# Patient Record
Sex: Female | Born: 1950 | Race: Black or African American | Hispanic: No | Marital: Married | Smoking: Never smoker
Health system: Southern US, Community
[De-identification: ages and names within clinical notes are randomized; demographics above are authoritative.]

## PROBLEM LIST (undated history)

## (undated) DIAGNOSIS — I219 Acute myocardial infarction, unspecified: Secondary | ICD-10-CM

## (undated) DIAGNOSIS — I10 Essential (primary) hypertension: Secondary | ICD-10-CM

## (undated) HISTORY — PX: COLON SURGERY: SHX602

## (undated) HISTORY — PX: OTHER SURGICAL HISTORY: SHX169

## (undated) HISTORY — PX: COLONOSCOPY: SHX174

---

## 2004-05-18 ENCOUNTER — Ambulatory Visit: Payer: Self-pay | Admitting: Internal Medicine

## 2004-05-21 ENCOUNTER — Ambulatory Visit: Payer: Self-pay | Admitting: Internal Medicine

## 2007-12-31 ENCOUNTER — Ambulatory Visit: Payer: Self-pay | Admitting: Gastroenterology

## 2008-01-21 ENCOUNTER — Ambulatory Visit: Payer: Self-pay | Admitting: General Surgery

## 2008-01-28 ENCOUNTER — Inpatient Hospital Stay: Payer: Self-pay | Admitting: General Surgery

## 2015-02-01 ENCOUNTER — Inpatient Hospital Stay
Admit: 2015-02-01 | Discharge: 2015-02-01 | Disposition: A | Discharge status: Home / Self Care | Payer: Medicare PPO | Attending: Internal Medicine | Admitting: Internal Medicine

## 2015-02-01 ENCOUNTER — Inpatient Hospital Stay
Admission: EM | Admit: 2015-02-01 | Discharge: 2015-02-03 | DRG: 247 | Disposition: A | Discharge status: Home / Self Care | Payer: Medicare PPO | Attending: Internal Medicine | Admitting: Internal Medicine

## 2015-02-01 ENCOUNTER — Emergency Department: Payer: Medicare PPO

## 2015-02-01 ENCOUNTER — Encounter: Payer: Self-pay | Admitting: Emergency Medicine

## 2015-02-01 DIAGNOSIS — R079 Chest pain, unspecified: Secondary | ICD-10-CM

## 2015-02-01 DIAGNOSIS — I214 Non-ST elevation (NSTEMI) myocardial infarction: Principal | ICD-10-CM | POA: Diagnosis present

## 2015-02-01 DIAGNOSIS — Z9889 Other specified postprocedural states: Secondary | ICD-10-CM | POA: Diagnosis not present

## 2015-02-01 DIAGNOSIS — Z9851 Tubal ligation status: Secondary | ICD-10-CM

## 2015-02-01 DIAGNOSIS — E785 Hyperlipidemia, unspecified: Secondary | ICD-10-CM | POA: Diagnosis present

## 2015-02-01 DIAGNOSIS — I200 Unstable angina: Secondary | ICD-10-CM

## 2015-02-01 DIAGNOSIS — Z8249 Family history of ischemic heart disease and other diseases of the circulatory system: Secondary | ICD-10-CM | POA: Diagnosis not present

## 2015-02-01 DIAGNOSIS — I4510 Unspecified right bundle-branch block: Secondary | ICD-10-CM | POA: Diagnosis present

## 2015-02-01 LAB — PROTIME-INR
INR: 1.01
PROTHROMBIN TIME: 13.5 s (ref 11.4–15.0)

## 2015-02-01 LAB — TROPONIN I
TROPONIN I: 2.29 ng/mL — AB (ref <0.031)
Troponin I: 1.44 ng/mL — ABNORMAL HIGH (ref <0.031)
Troponin I: 2.87 ng/mL — ABNORMAL HIGH (ref <0.031)

## 2015-02-01 LAB — COMPREHENSIVE METABOLIC PANEL
ALBUMIN: 4.4 g/dL (ref 3.5–5.0)
ALK PHOS: 85 U/L (ref 38–126)
ALT: 16 U/L (ref 14–54)
ANION GAP: 7 (ref 5–15)
AST: 35 U/L (ref 15–41)
BILIRUBIN TOTAL: 0.3 mg/dL (ref 0.3–1.2)
BUN: 16 mg/dL (ref 6–20)
CALCIUM: 9.6 mg/dL (ref 8.9–10.3)
CO2: 28 mmol/L (ref 22–32)
CREATININE: 1.18 mg/dL — AB (ref 0.44–1.00)
Chloride: 107 mmol/L (ref 101–111)
GFR calc Af Amer: 55 mL/min — ABNORMAL LOW (ref >60)
GFR calc non Af Amer: 47 mL/min — ABNORMAL LOW (ref >60)
GLUCOSE: 100 mg/dL — AB (ref 65–99)
Potassium: 3.6 mmol/L (ref 3.5–5.1)
SODIUM: 142 mmol/L (ref 135–145)
TOTAL PROTEIN: 8.2 g/dL — AB (ref 6.5–8.1)

## 2015-02-01 LAB — HEPARIN LEVEL (UNFRACTIONATED)
HEPARIN UNFRACTIONATED: 1.18 [IU]/mL — AB (ref 0.30–0.70)
Heparin Unfractionated: 0.95 IU/mL — ABNORMAL HIGH (ref 0.30–0.70)

## 2015-02-01 LAB — CBC
HEMATOCRIT: 43.5 % (ref 35.0–47.0)
HEMOGLOBIN: 14.5 g/dL (ref 12.0–16.0)
MCH: 32.5 pg (ref 26.0–34.0)
MCHC: 33.3 g/dL (ref 32.0–36.0)
MCV: 97.6 fL (ref 80.0–100.0)
Platelets: 175 10*3/uL (ref 150–440)
RBC: 4.46 MIL/uL (ref 3.80–5.20)
RDW: 12.5 % (ref 11.5–14.5)
WBC: 6.3 10*3/uL (ref 3.6–11.0)

## 2015-02-01 LAB — APTT: aPTT: 27 seconds (ref 24–36)

## 2015-02-01 MED ORDER — SODIUM CHLORIDE 0.9 % WEIGHT BASED INFUSION
3.0000 mL/kg/h | INTRAVENOUS | Status: DC
  Administered 2015-02-02: 3 mL/kg/h via INTRAVENOUS

## 2015-02-01 MED ORDER — ASPIRIN 81 MG PO CHEW
81.0000 mg | CHEWABLE_TABLET | ORAL | Status: AC
  Administered 2015-02-02: 81 mg via ORAL
  Filled 2015-02-01: qty 1

## 2015-02-01 MED ORDER — HEPARIN (PORCINE) IN NACL 100-0.45 UNIT/ML-% IJ SOLN
600.0000 [IU]/h | INTRAMUSCULAR | Status: DC
Start: 2015-02-01 — End: 2015-02-02
  Administered 2015-02-01: 800 [IU]/h via INTRAVENOUS
  Filled 2015-02-01: qty 250

## 2015-02-01 MED ORDER — GI COCKTAIL ~~LOC~~
30.0000 mL | Freq: Once | ORAL | Status: AC
  Administered 2015-02-01: 30 mL via ORAL
  Filled 2015-02-01: qty 30

## 2015-02-01 MED ORDER — ACETAMINOPHEN 325 MG PO TABS: 650.0000 mg | ORAL_TABLET | Freq: Four times a day (QID) | ORAL | Status: DC | PRN

## 2015-02-01 MED ORDER — ONDANSETRON HCL 4 MG/2ML IJ SOLN
4.0000 mg | Freq: Once | INTRAMUSCULAR | Status: AC
  Administered 2015-02-01: 4 mg via INTRAVENOUS
  Filled 2015-02-01: qty 2

## 2015-02-01 MED ORDER — SODIUM CHLORIDE 0.9 % IV SOLN
INTRAVENOUS | Status: DC
  Administered 2015-02-01: 18:00:00 via INTRAVENOUS

## 2015-02-01 MED ORDER — HYDROCODONE-ACETAMINOPHEN 5-325 MG PO TABS: 1.0000 | ORAL_TABLET | ORAL | Status: DC | PRN

## 2015-02-01 MED ORDER — SENNOSIDES-DOCUSATE SODIUM 8.6-50 MG PO TABS: 1.0000 | ORAL_TABLET | Freq: Every evening | ORAL | Status: DC | PRN

## 2015-02-01 MED ORDER — INFLUENZA VAC SPLIT QUAD 0.5 ML IM SUSY: 0.5000 mL | PREFILLED_SYRINGE | INTRAMUSCULAR | Status: DC

## 2015-02-01 MED ORDER — SODIUM CHLORIDE 0.9 % IJ SOLN
3.0000 mL | Freq: Two times a day (BID) | INTRAMUSCULAR | Status: DC
  Administered 2015-02-02: 3 mL via INTRAVENOUS

## 2015-02-01 MED ORDER — ONDANSETRON HCL 4 MG PO TABS
4.0000 mg | ORAL_TABLET | Freq: Four times a day (QID) | ORAL | Status: DC | PRN
Start: 2015-02-01 — End: 2015-02-03

## 2015-02-01 MED ORDER — ALUM & MAG HYDROXIDE-SIMETH 200-200-20 MG/5ML PO SUSP: 30.0000 mL | Freq: Four times a day (QID) | ORAL | Status: DC | PRN

## 2015-02-01 MED ORDER — OMEGA-3-ACID ETHYL ESTERS 1 G PO CAPS
1.0000 g | ORAL_CAPSULE | Freq: Every day | ORAL | Status: DC
  Administered 2015-02-01 – 2015-02-03 (×3): 1 g via ORAL
  Filled 2015-02-01 (×4): qty 1

## 2015-02-01 MED ORDER — ACETAMINOPHEN 650 MG RE SUPP: 650.0000 mg | Freq: Four times a day (QID) | RECTAL | Status: DC | PRN

## 2015-02-01 MED ORDER — METOPROLOL TARTRATE 25 MG PO TABS
25.0000 mg | ORAL_TABLET | Freq: Two times a day (BID) | ORAL | Status: DC
  Administered 2015-02-02 – 2015-02-03 (×3): 25 mg via ORAL
  Filled 2015-02-01 (×4): qty 1

## 2015-02-01 MED ORDER — SODIUM CHLORIDE 0.9 % WEIGHT BASED INFUSION: 1.0000 mL/kg/h | INTRAVENOUS | Status: DC

## 2015-02-01 MED ORDER — ONDANSETRON HCL 4 MG/2ML IJ SOLN: 4.0000 mg | Freq: Four times a day (QID) | INTRAMUSCULAR | Status: DC | PRN

## 2015-02-01 MED ORDER — HEPARIN BOLUS VIA INFUSION
4000.0000 [IU] | Freq: Once | INTRAVENOUS | Status: AC
  Administered 2015-02-01: 4000 [IU] via INTRAVENOUS
  Filled 2015-02-01: qty 4000

## 2015-02-01 MED ORDER — NITROGLYCERIN 0.4 MG SL SUBL: 0.4000 mg | SUBLINGUAL_TABLET | SUBLINGUAL | Status: DC | PRN

## 2015-02-01 MED ORDER — SIMVASTATIN 20 MG PO TABS
20.0000 mg | ORAL_TABLET | Freq: Every day | ORAL | Status: DC
  Administered 2015-02-01: 20 mg via ORAL
  Filled 2015-02-01: qty 1

## 2015-02-01 MED ORDER — ASPIRIN EC 81 MG PO TBEC
81.0000 mg | DELAYED_RELEASE_TABLET | Freq: Every day | ORAL | Status: DC
  Administered 2015-02-01 – 2015-02-03 (×3): 81 mg via ORAL
  Filled 2015-02-01 (×3): qty 1

## 2015-02-01 MED ORDER — ASPIRIN 81 MG PO CHEW
324.0000 mg | CHEWABLE_TABLET | Freq: Once | ORAL | Status: AC
  Administered 2015-02-01: 324 mg via ORAL
  Filled 2015-02-01: qty 4

## 2015-02-01 MED ORDER — KRILL OIL OMEGA-3 300 MG PO CAPS: ORAL_CAPSULE | Freq: Every day | ORAL | Status: DC

## 2015-02-01 NOTE — Progress Notes (Signed)
ANTICOAGULATION CONSULT NOTE - Initial Consult  Pharmacy Consult for heparin drip monitoring and dosing Indication: chest pain/ACS  No Known Allergies  Patient Measurements: Height:  (175.3 cm) Weight: 145 lb (65.772 kg) IBW/kg (Calculated) : 66.2 Heparin Dosing Weight: 65.8 kg  Vital Signs: Temp: 97.8 F (36.6 C) (09/13 0952) Temp Source: Oral (09/13 0952) BP: 153/74 mmHg (09/13 1435) Pulse Rate: 60 (09/13 1435)  Labs:  Recent Labs  02/01/15 1047  HGB 14.5  HCT 43.5  PLT 175  APTT 27  LABPROT 13.5  INR 1.01  CREATININE 1.18*  TROPONINI 1.44*    Estimated Creatinine Clearance: 49.4 mL/min (by C-G formula based on Cr of 1.18).   Medical History: History reviewed. No pertinent past medical history.  Medications:  Scheduled:    Assessment: Pharmacy consulted to dose heparin drip for ACS/chest pain.   Goal of Therapy:  Heparin level 0.3-0.7 units/ml   Plan:  Give 4000 units bolus x 1 Start heparin infusion at 800 units/hr Check anti-Xa level in 6 hours and daily while on heparin Continue to monitor H&H and platelets  Jodelle Red Lyndsi Altic 02/01/2015,2:48 PM

## 2015-02-01 NOTE — ED Provider Notes (Signed)
[facility] Emergency Department Provider Note  ____________________________________________  Time seen: On arrival  I have reviewed the triage vital signs and the nursing notes.   HISTORY  Chief Complaint Heartburn    HPI Kristina Macias is a 64 y.o. female who presents with complaints of burning in her chest intermittently for 2 days. She reports the pain does get worse with exertion and resolves with rest. She is never had this before. She is the pain is burning and severe at times. She denies shortness of breath. No recent travel. No lower extremity swelling. No fevers chills or cough     History reviewed. No pertinent past medical history.  There are no active problems to display for this patient.   Past Surgical History  Procedure Laterality Date  . Colon surgery      Current Outpatient Rx  Name  Route  Sig  Dispense  Refill  . calcium-vitamin D (OSCAL WITH D) 500-200 MG-UNIT per tablet   Oral   Take 1 tablet by mouth.         Marland Kitchen KRILL OIL OMEGA-3 PO   Oral   Take 1 capsule by mouth daily.           Allergies Review of patient's allergies indicates no known allergies.  No family history on file.  Social History Social History  Substance Use Topics  . Smoking status: Never Smoker   . Smokeless tobacco: None  . Alcohol Use: No    Review of Systems  Constitutional: Negative for fever. Eyes: Negative for visual changes. ENT: Negative for sore throat Cardiovascular: Positive for chest pain. Respiratory: Negative for shortness of breath. Gastrointestinal: Negative for abdominal pain, vomiting and diarrhea. Genitourinary: Negative for dysuria. Musculoskeletal: Negative for back pain. Skin: Negative for rash. Neurological: Negative for headaches or focal weakness Psychiatric: No Anxiety    ____________________________________________   PHYSICAL EXAM:  VITAL SIGNS: ED Triage Vitals  Enc Vitals Group     BP 02/01/15  0952 139/73 mmHg     Pulse Rate 02/01/15 0952 84     Resp 02/01/15 0952 18     Temp 02/01/15 0952 97.8 F (36.6 C)     Temp Source 02/01/15 0952 Oral     SpO2 02/01/15 0952 100 %     Weight 02/01/15 0952 145 lb (65.772 kg)     Height 02/01/15 0952 5\' 9"  (1.753 m)     Head Cir --      Peak Flow --      Pain Score 02/01/15 1005 9     Pain Loc --      Pain Edu? --      Excl. in GC? --      Constitutional: Alert and oriented. Well appearing and in no distress. Eyes: Conjunctivae are normal.  ENT   Head: Normocephalic and atraumatic.   Mouth/Throat: Mucous membranes are moist. Cardiovascular: Normal rate, regular rhythm. Normal and symmetric distal pulses are present in all extremities. No murmurs, rubs, or gallops. Respiratory: Normal respiratory effort without tachypnea nor retractions. Breath sounds are clear and equal bilaterally.  Gastrointestinal: Soft and non-tender in all quadrants. No distention. There is no CVA tenderness. Genitourinary: deferred Musculoskeletal: Nontender with normal range of motion in all extremities. No lower extremity tenderness nor edema. Neurologic:  Normal speech and language. No gross focal neurologic deficits are appreciated. Skin:  Skin is warm, dry and intact. No rash noted. Psychiatric: Mood and affect are normal. Patient exhibits appropriate insight and judgment.  ____________________________________________    LABS (pertinent positives/negatives)  Labs Reviewed  COMPREHENSIVE METABOLIC PANEL - Abnormal; Notable for the following:    Glucose, Bld 100 (*)    Creatinine, Ser 1.18 (*)    Total Protein 8.2 (*)    GFR calc non Af Amer 47 (*)    GFR calc Af Amer 55 (*)    All other components within normal limits  TROPONIN I - Abnormal; Notable for the following:    Troponin I 1.44 (*)    All other components within normal limits  CBC  APTT  PROTIME-INR    ____________________________________________   EKG  ED ECG REPORT I,  Jene Every, the attending physician, personally viewed and interpreted this ECG.   Date: 02/01/2015  EKG Time: 10:11 AM  Rate: 73  Rhythm: RBBB  Axis: Left axis  Intervals:right bundle branch block  ST&T Change: Nonspecific  No old EKG to compare to ____________________________________________    RADIOLOGY I have personally reviewed any xrays that were ordered on this patient: Chest x-ray unremarkable  ____________________________________________   PROCEDURES  Procedure(s) performed: none  Critical Care performed: yes  CRITICAL CARE Performed by: Jene Every   Total critical care time: 30 min  Critical care time was exclusive of separately billable procedures and treating other patients.  Critical care was necessary to treat or prevent imminent or life-threatening deterioration.  Critical care was time spent personally by me on the following activities: development of treatment plan with patient and/or surrogate as well as nursing, discussions with consultants, evaluation of patient's response to treatment, examination of patient, obtaining history from patient or surrogate, ordering and performing treatments and interventions, ordering and review of laboratory studies, ordering and review of radiographic studies, pulse oximetry and re-evaluation of patient's condition.   ____________________________________________   INITIAL IMPRESSION / ASSESSMENT AND PLAN / ED COURSE  Pertinent labs & imaging results that were available during my care of the patient were reviewed by me and considered in my medical decision making (see chart for details).  Patient with burning sensation in the chest with concerning worsening with exertion. She has only mild pain currently. We will give a GI cocktail but also check cardiac enzymes, chest x-ray, EKG and then reevaluate.  ----------------------------------------- 12:48 PM on  02/01/2015 -----------------------------------------  Patient reports she is totally pain-free. However her troponin is elevated and given her history of present illness I'm concerned about unstable angina. I will order heparin drip and admit the patient  ____________________________________________   FINAL CLINICAL IMPRESSION(S) / ED DIAGNOSES  Final diagnoses:  Unstable angina     Jene Every, MD 02/01/15 1248

## 2015-02-01 NOTE — Consult Note (Signed)
[facility] Cardiology  CARDIOLOGY CONSULT NOTE  Patient ID: Kristina Macias MRN: [id number] DOB/AGE: 1950-08-21 64 y.o.  Admit date: 02/01/2015 Referring Physician [facility] Primary Physician  Primary Cardiologist  Reason for Consultation NSTEMI  HPI: The patient is a 64 year old female referred for evaluation of non-ST elevation myocardial infarction.the patient reports a 2 day history of intermittent episodes of chest pain. She describes sternal chest discomfort, feels like burning, typically occurs with exertion, relieved with rest. There is typically last 5-10 minutes. A day, the patient picked up her grandchild prior to getting her car, mid substernal chest discomfort without radiation, nausea or vomiting. He called her husband, who brought her to [facility] emergency room. EKG revealed sinus rhythm with right bundle-branch block. Patient was given an aspirin and a GI cocktail.initial lab work revealed elevated troponin of 1.44.  Review of systems complete and found to be negative unless listed above     History reviewed. No pertinent past medical history.  Past Surgical History  Procedure Laterality Date  . Colon surgery      Prescriptions prior to admission  Medication Sig Dispense Refill Last Dose  . calcium-vitamin D (OSCAL WITH D) 500-200 MG-UNIT per tablet Take 1 tablet by mouth.     Marland Kitchen KRILL OIL OMEGA-3 PO Take 1 capsule by mouth daily.      Social History   Social History  . Marital Status: Married    Spouse Name: N/A  . Number of Children: N/A  . Years of Education: N/A   Occupational History  . Not on file.   Social History Main Topics  . Smoking status: Never Smoker   . Smokeless tobacco: Not on file  . Alcohol Use: No  . Drug Use: Not on file  . Sexual Activity: Not on file   Other Topics Concern  . Not on file   Social History Narrative  . No narrative on file    No family history on file.    Review of systems complete and found to be negative unless listed above       PHYSICAL EXAM  General: Well developed, well nourished, in no acute distress HEENT:  Normocephalic and atramatic Neck:  No JVD.  Lungs: Clear bilaterally to auscultation and percussion. Heart: HRRR . Normal S1 and S2 without gallops or murmurs.  Abdomen: Bowel sounds are positive, abdomen soft and non-tender  Msk:  Back normal, normal gait. Normal strength and tone for age. Extremities: No clubbing, cyanosis or edema.   Neuro: Alert and oriented X 3. Psych:  Good affect, responds appropriately  Labs:   Lab Results  Component Value Date   WBC 6.3 02/01/2015   HGB 14.5 02/01/2015   HCT 43.5 02/01/2015   MCV 97.6 02/01/2015   PLT 175 02/01/2015    Recent Labs Lab 02/01/15 1047  NA 142  K 3.6  CL 107  CO2 28  BUN 16  CREATININE 1.18*  CALCIUM 9.6  PROT 8.2*  BILITOT 0.3  ALKPHOS 85  ALT 16  AST 35  GLUCOSE 100*   Lab Results  Component Value Date   TROPONINI 1.44* 02/01/2015   No results found for: CHOL No results found for: HDL No results found for: LDLCALC No results found for: TRIG No results found for: CHOLHDL No results found for: LDLDIRECT    Radiology: Dg Chest Portable 1 View  02/01/2015   CLINICAL DATA:  Acute chest pain for 2 days.  EXAM: PORTABLE CHEST - 1 VIEW  COMPARISON:  None.  FINDINGS: The cardiomediastinal silhouette is unremarkable.  There is no evidence of focal airspace disease, pulmonary edema, suspicious pulmonary nodule/mass, pleural effusion, or pneumothorax. No acute bony abnormalities are identified.  IMPRESSION: No active disease.   Electronically Signed   By: Harmon Pier M.D.   On: 02/01/2015 12:14    EKG: normal sinus rhythm with right bundle branch block  ASSESSMENT AND PLAN:   Non-ST elevation myocardial infarction, with recurrent episodes of chest pain, with EKG revealed right bundle branch block, and elevated troponin.  Recommendations  1. Agree with overall current therapy 2. Continue heparin drip 3.  Proceed  with cardiac catheterization with selective coronary arteriography scheduled for 02/02/15. The risks, benefits and alternatives to cardiac catheterization were explained to the patient and informed written consent was obtained.  SignedMarcina Millard MD,PhD, [facility] 02/01/2015, 5:45 PM

## 2015-02-01 NOTE — ED Notes (Signed)
Pt states has had "burning" in her chest since Sunday. States it only hurts when she moves.

## 2015-02-01 NOTE — Progress Notes (Signed)
ANTICOAGULATION CONSULT NOTE - Initial Consult  Pharmacy Consult for heparin drip monitoring and dosing Indication: chest pain/ACS  No Known Allergies  Patient Measurements: Height:  (175.3 cm) Weight: 131 lb 3.2 oz (59.512 kg) IBW/kg (Calculated) : 66.2 Heparin Dosing Weight: 65.8 kg  Vital Signs: Temp: 98.5 F (36.9 C) (09/13 1941) Temp Source: Oral (09/13 1941) BP: 121/64 mmHg (09/13 1941) Pulse Rate: 66 (09/13 1941)  Labs:  Recent Labs  02/01/15 1047 02/01/15 1720 02/01/15 2106  HGB 14.5  --   --   HCT 43.5  --   --   PLT 175  --   --   APTT 27  --   --   LABPROT 13.5  --   --   INR 1.01  --   --   HEPARINUNFRC  --  1.18* 0.95*  CREATININE 1.18*  --   --   TROPONINI 1.44* 2.29*  --     Estimated Creatinine Clearance: 44.6 mL/min (by C-G formula based on Cr of 1.18).   Medical History: History reviewed. No pertinent past medical history.  Medications:  Scheduled:  . [START ON 02/02/2015] aspirin  81 mg Oral Pre-Cath  . aspirin EC  81 mg Oral Daily  . [START ON 02/02/2015] Influenza vac split quadrivalent PF  0.5 mL Intramuscular Tomorrow-1000  . metoprolol tartrate  25 mg Oral BID  . omega-3 acid ethyl esters  1 g Oral Daily  . simvastatin  20 mg Oral q1800  . sodium chloride  3 mL Intravenous Q12H    Assessment: Pharmacy consulted to dose heparin drip for ACS/chest pain.   Goal of Therapy:  Heparin level 0.3-0.7 units/ml   Plan:  Give 4000 units bolus x 1 Start heparin infusion at 800 units/hr Check anti-Xa level in 6 hours and daily while on heparin Continue to monitor H&H and platelets   9/13:  HL @ 21:30 =  0.95 Will decrease rate to 700 units/hr and recheck HL on 9/14 @ 4:00.   Luretha Eberly D 02/01/2015,10:17 PM

## 2015-02-01 NOTE — Progress Notes (Signed)
*  PRELIMINARY RESULTS* Echocardiogram 2D Echocardiogram has been performed.  Kristina Macias 02/01/2015, 6:57 PM

## 2015-02-01 NOTE — H&P (Signed)
[facility] [place] [facility]   PATIENT NAME: Kristina Macias    MR#:  [id number]  DATE OF BIRTH:  08-11-1950  DATE OF ADMISSION:  02/01/2015  PRIMARY CARE PHYSICIAN: Barbette Merino, NP   REQUESTING/REFERRING PHYSICIAN: Dr. Cyril Loosen  CHIEF COMPLAINT:  Burning pain near heart HISTORY OF PRESENT ILLNESS:  Kristina Macias  is a 64 y.o. female with no past medical history who presents with above complaint. Patient reports and send a she's had burning midsternal pain without radiation. She says the burning is worse with exertion and better with rest. Last prostate 10 minutes. It has been occurring a few times a day since Sunday. She was worried so she presented to the ER for further evaluation. In the emergency room she had a GI cocktail which alleviated the burning sensation. She had an EKG which shows a right bundle branch block and LAD as well as an elevated troponin. There are no associated symptoms with this burning pain. She is not recently traveled far distance or had recent surgery. She does not have tachycardia or hypoxia in the emergency room. PAST MEDICAL HISTORY:  None  PAST SURGICAL HISTORY:   Past Surgical History  Procedure Laterality Date  . Colon surgery     tubal ligation  SOCIAL HISTORY:   Social History  Substance Use Topics  . Smoking status: Never Smoker   . Smokeless tobacco: Not on file  . Alcohol Use: No    FAMILY HISTORY:  Positive history of hypertension no CAD or stroke  DRUG ALLERGIES:  No Known Allergies   REVIEW OF SYSTEMS:  CONSTITUTIONAL: No fever, fatigue or weakness.  EYES: No blurred or double vision.  EARS, NOSE, AND THROAT: No tinnitus or ear pain.  RESPIRATORY: No cough, shortness of breath, wheezing or hemoptysis.  CARDIOVASCULAR: No chest pain, orthopnea, edema. She denies chest pain but has burning sensation midepigastric region GASTROINTESTINAL: No nausea, vomiting, diarrhea or abdominal pain.   GENITOURINARY: No dysuria, hematuria.  ENDOCRINE: No polyuria, nocturia,  HEMATOLOGY: No anemia, easy bruising or bleeding SKIN: No rash or lesion. MUSCULOSKELETAL: No joint pain or arthritis.   NEUROLOGIC: No tingling, numbness, weakness.  PSYCHIATRY: No anxiety or depression.   MEDICATIONS AT HOME:   Prior to Admission medications   Medication Sig Start Date End Date Taking? Authorizing Provider  calcium-vitamin D (OSCAL WITH D) 500-200 MG-UNIT per tablet Take 1 tablet by mouth.   Yes Historical Provider, MD  KRILL OIL OMEGA-3 PO Take 1 capsule by mouth daily.   Yes Historical Provider, MD      VITAL SIGNS:  Blood pressure 141/77, pulse 63, temperature 97.8 F (36.6 C), temperature source Oral, resp. rate 18, height  (1.753 m), weight 65.772 kg (145 lb), SpO2 100 %.  PHYSICAL EXAMINATION:  GENERAL:  64 y.o.-year-old patient lying in the bed with no acute distress.  EYES: Pupils equal, round, reactive to light and accommodation. No scleral icterus. Extraocular muscles intact.  HEENT: Head atraumatic, normocephalic. Oropharynx and nasopharynx clear.  NECK:  Supple, no jugular venous distention. No thyroid enlargement, no tenderness.  LUNGS: Normal breath sounds bilaterally, no wheezing, rales,rhonchi or crepitation. No use of accessory muscles of respiration.  CARDIOVASCULAR: S1, S2 normal. No murmurs, rubs, or gallops.  ABDOMEN: Soft, nontender, nondistended. Bowel sounds present. No organomegaly or mass.  EXTREMITIES: No pedal edema, cyanosis, or clubbing.  NEUROLOGIC: Cranial nerves II through XII are grossly intact. No focal deficits. PSYCHIATRIC: The patient is alert and oriented x  3.  SKIN: No obvious rash, lesion, or ulcer.   LABORATORY PANEL:   CBC  Recent Labs Lab 02/01/15 1047  WBC 6.3  HGB 14.5  HCT 43.5  PLT 175   ------------------------------------------------------------------------------------------------------------------  Chemistries   Recent  Labs Lab 02/01/15 1047  NA 142  K 3.6  CL 107  CO2 28  GLUCOSE 100*  BUN 16  CREATININE 1.18*  CALCIUM 9.6  AST 35  ALT 16  ALKPHOS 85  BILITOT 0.3   ------------------------------------------------------------------------------------------------------------------  Cardiac Enzymes  Recent Labs Lab 02/01/15 1047  TROPONINI 1.44*   ------------------------------------------------------------------------------------------------------------------  RADIOLOGY:  Dg Chest Portable 1 View  02/01/2015   CLINICAL DATA:  Acute chest pain for 2 days.  EXAM: PORTABLE CHEST - 1 VIEW  COMPARISON:  None.  FINDINGS: The cardiomediastinal silhouette is unremarkable.  There is no evidence of focal airspace disease, pulmonary edema, suspicious pulmonary nodule/mass, pleural effusion, or pneumothorax. No acute bony abnormalities are identified.  IMPRESSION: No active disease.   Electronically Signed   By: Harmon Pier M.D.   On: 02/01/2015 12:14    EKG:  Normal sinus rhythm with right bundle branch block  IMPRESSION AND PLAN:  This is a very pleasant 64 year old female with a past medical history who presents with burning sensation worse with exertion and better with rest and found to have a elevation in her troponin.   1. Non-STEMI: Patient presents with burning sensation in the midepigastric region worse with exertion and better with rest with no associated symptoms. Her troponin is elevated which is concerning for non-STEMI. Patient has consented for heparin drip. I have reviewed side effects, alternatives and risks including bleeding. She consents to these risks and accepts these risk. I have started aspirin, beta blocker and statin medication which I have also reviewed with her. I will speak with the cardiologist on call and order echocardiogram. Further recommendations as per cardiology. Lipid panel will be ordered for a.m. as well.      All the records are reviewed and case discussed with  ED provider. Management plans discussed with the patient and she is in agreement.  CODE STATUS: FULL  TOTAL TIME TAKING CARE OF THIS PATIENT: 45 minutes.    Kristina Macias M.D on 02/01/2015 at 1:37 PM  Between 7am to 6pm - Pager - [phone] After 6pm go to [email or web address] - password [email or web address] [facility]  [facility]  Office  [phone]  CC: Primary care physician; Barbette Merino, NP

## 2015-02-02 ENCOUNTER — Encounter
Admission: EM | Disposition: A | Discharge status: Home / Self Care | Payer: Self-pay | Source: Home / Self Care | Attending: Internal Medicine

## 2015-02-02 HISTORY — PX: CARDIAC CATHETERIZATION: SHX172

## 2015-02-02 LAB — CBC WITH DIFFERENTIAL/PLATELET
BASOS ABS: 0 10*3/uL (ref 0–0.1)
BASOS PCT: 1 %
EOS ABS: 0 10*3/uL (ref 0–0.7)
Eosinophils Relative: 1 %
HCT: 37.3 % (ref 35.0–47.0)
HEMOGLOBIN: 12.7 g/dL (ref 12.0–16.0)
Lymphocytes Relative: 36 %
Lymphs Abs: 2.1 10*3/uL (ref 1.0–3.6)
MCH: 32.9 pg (ref 26.0–34.0)
MCHC: 34.1 g/dL (ref 32.0–36.0)
MCV: 96.4 fL (ref 80.0–100.0)
Monocytes Absolute: 0.5 10*3/uL (ref 0.2–0.9)
Monocytes Relative: 9 %
NEUTROS PCT: 55 %
Neutro Abs: 3.2 10*3/uL (ref 1.4–6.5)
Platelets: 156 10*3/uL (ref 150–440)
RBC: 3.87 MIL/uL (ref 3.80–5.20)
RDW: 11.9 % (ref 11.5–14.5)
WBC: 5.8 10*3/uL (ref 3.6–11.0)

## 2015-02-02 LAB — BASIC METABOLIC PANEL
Anion gap: 3 — ABNORMAL LOW (ref 5–15)
BUN: 15 mg/dL (ref 6–20)
CHLORIDE: 111 mmol/L (ref 101–111)
CO2: 28 mmol/L (ref 22–32)
Calcium: 8.6 mg/dL — ABNORMAL LOW (ref 8.9–10.3)
Creatinine, Ser: 1.13 mg/dL — ABNORMAL HIGH (ref 0.44–1.00)
GFR calc Af Amer: 58 mL/min — ABNORMAL LOW (ref >60)
GFR, EST NON AFRICAN AMERICAN: 50 mL/min — AB (ref >60)
GLUCOSE: 93 mg/dL (ref 65–99)
POTASSIUM: 4 mmol/L (ref 3.5–5.1)
Sodium: 142 mmol/L (ref 135–145)

## 2015-02-02 LAB — HEMOGLOBIN A1C: HEMOGLOBIN A1C: 5.6 % (ref 4.0–6.0)

## 2015-02-02 LAB — LIPID PANEL
CHOL/HDL RATIO: 4.2 ratio
Cholesterol: 237 mg/dL — ABNORMAL HIGH (ref 0–200)
HDL: 56 mg/dL (ref >40)
LDL CALC: 172 mg/dL — AB (ref 0–99)
Triglycerides: 47 mg/dL (ref <150)
VLDL: 9 mg/dL (ref 0–40)

## 2015-02-02 LAB — TROPONIN I: Troponin I: 2.53 ng/mL — ABNORMAL HIGH (ref <0.031)

## 2015-02-02 LAB — HEPARIN LEVEL (UNFRACTIONATED): HEPARIN UNFRACTIONATED: 0.88 [IU]/mL — AB (ref 0.30–0.70)

## 2015-02-02 SURGERY — LEFT HEART CATH AND CORONARY ANGIOGRAPHY

## 2015-02-02 MED ORDER — FENTANYL CITRATE (PF) 100 MCG/2ML IJ SOLN
INTRAMUSCULAR | Status: DC | PRN
  Administered 2015-02-02: 50 ug via INTRAVENOUS

## 2015-02-02 MED ORDER — PROMETHAZINE HCL 25 MG/ML IJ SOLN
12.5000 mg | Freq: Once | INTRAMUSCULAR | Status: AC
  Administered 2015-02-02 (×2): 12.5 mg via INTRAVENOUS

## 2015-02-02 MED ORDER — SODIUM CHLORIDE 0.9 % IJ SOLN: 3.0000 mL | INTRAMUSCULAR | Status: DC | PRN

## 2015-02-02 MED ORDER — BIVALIRUDIN 250 MG IV SOLR
INTRAVENOUS | Status: AC
  Filled 2015-02-02: qty 250

## 2015-02-02 MED ORDER — ASPIRIN 81 MG PO CHEW: 81.0000 mg | CHEWABLE_TABLET | ORAL | Status: DC

## 2015-02-02 MED ORDER — SODIUM CHLORIDE 0.9 % IV SOLN: INTRAVENOUS | Status: DC

## 2015-02-02 MED ORDER — NITROGLYCERIN 1 MG/10 ML FOR IR/CATH LAB
INTRA_ARTERIAL | Status: DC | PRN
  Administered 2015-02-02 (×3): 200 ug via INTRACORONARY

## 2015-02-02 MED ORDER — FENTANYL CITRATE (PF) 100 MCG/2ML IJ SOLN
INTRAMUSCULAR | Status: AC
  Filled 2015-02-02: qty 2

## 2015-02-02 MED ORDER — TIROFIBAN HCL IV 5 MG/100ML
0.1500 ug/kg/min | INTRAVENOUS | Status: DC
  Administered 2015-02-02: 0.15 ug/kg/min via INTRAVENOUS

## 2015-02-02 MED ORDER — CLOPIDOGREL BISULFATE 75 MG PO TABS
ORAL_TABLET | ORAL | Status: AC
  Filled 2015-02-02: qty 8

## 2015-02-02 MED ORDER — ONDANSETRON HCL 4 MG/2ML IJ SOLN
INTRAMUSCULAR | Status: DC | PRN
  Administered 2015-02-02: 4 mg via INTRAVENOUS

## 2015-02-02 MED ORDER — ASPIRIN 81 MG PO CHEW
CHEWABLE_TABLET | ORAL | Status: DC | PRN
  Administered 2015-02-02: 243 mg via ORAL

## 2015-02-02 MED ORDER — SODIUM CHLORIDE 0.9 % IV SOLN
INTRAVENOUS | Status: DC
  Administered 2015-02-02: 17:00:00 via INTRAVENOUS

## 2015-02-02 MED ORDER — SODIUM CHLORIDE 0.9 % WEIGHT BASED INFUSION: 3.0000 mL/kg/h | INTRAVENOUS | Status: DC

## 2015-02-02 MED ORDER — SODIUM CHLORIDE 0.9 % IJ SOLN
3.0000 mL | Freq: Two times a day (BID) | INTRAMUSCULAR | Status: DC
  Administered 2015-02-02: 3 mL via INTRAVENOUS

## 2015-02-02 MED ORDER — SODIUM CHLORIDE 0.9 % IV SOLN
250.0000 mg | INTRAVENOUS | Status: DC | PRN
  Administered 2015-02-02: 1.75 mg/kg/h via INTRAVENOUS

## 2015-02-02 MED ORDER — ATORVASTATIN CALCIUM 20 MG PO TABS
80.0000 mg | ORAL_TABLET | Freq: Every day | ORAL | Status: DC
  Administered 2015-02-02: 80 mg via ORAL
  Filled 2015-02-02: qty 4
  Filled 2015-02-02: qty 1

## 2015-02-02 MED ORDER — TIROFIBAN HCL IV 12.5 MG/250 ML
INTRAVENOUS | Status: AC
  Filled 2015-02-02: qty 250

## 2015-02-02 MED ORDER — MIDAZOLAM HCL 2 MG/2ML IJ SOLN
INTRAMUSCULAR | Status: DC | PRN
  Administered 2015-02-02: 1 mg via INTRAVENOUS
  Administered 2015-02-02: 0.5 mg via INTRAVENOUS

## 2015-02-02 MED ORDER — ASPIRIN 81 MG PO CHEW
CHEWABLE_TABLET | ORAL | Status: AC
  Filled 2015-02-02: qty 3

## 2015-02-02 MED ORDER — ONDANSETRON HCL 4 MG/2ML IJ SOLN
INTRAMUSCULAR | Status: AC
  Filled 2015-02-02: qty 2

## 2015-02-02 MED ORDER — HEPARIN (PORCINE) IN NACL 2-0.9 UNIT/ML-% IJ SOLN
INTRAMUSCULAR | Status: AC
  Filled 2015-02-02: qty 1000

## 2015-02-02 MED ORDER — NITROGLYCERIN 5 MG/ML IV SOLN
INTRAVENOUS | Status: AC
  Filled 2015-02-02: qty 10

## 2015-02-02 MED ORDER — SODIUM CHLORIDE 0.9 % IV SOLN: 250.0000 mL | INTRAVENOUS | Status: DC | PRN

## 2015-02-02 MED ORDER — BIVALIRUDIN BOLUS VIA INFUSION - CUPID
INTRAVENOUS | Status: DC | PRN
  Administered 2015-02-02: 44.55 mg via INTRAVENOUS

## 2015-02-02 MED ORDER — TIROFIBAN (AGGRASTAT) BOLUS VIA INFUSION
INTRAVENOUS | Status: DC | PRN
  Administered 2015-02-02: 1485 ug via INTRAVENOUS

## 2015-02-02 MED ORDER — IOHEXOL 300 MG/ML  SOLN
INTRAMUSCULAR | Status: DC | PRN
Start: 2015-02-02 — End: 2015-02-02
  Administered 2015-02-02: 90 mL via INTRA_ARTERIAL
  Administered 2015-02-02: 150 mL via INTRA_ARTERIAL
  Administered 2015-02-02: 30 mL via INTRA_ARTERIAL
  Administered 2015-02-02: 100 mL via INTRA_ARTERIAL
  Administered 2015-02-02: 30 mL via INTRA_ARTERIAL

## 2015-02-02 MED ORDER — SODIUM CHLORIDE 0.9 % WEIGHT BASED INFUSION: 1.0000 mL/kg/h | INTRAVENOUS | Status: DC

## 2015-02-02 MED ORDER — PROMETHAZINE HCL 25 MG/ML IJ SOLN
INTRAMUSCULAR | Status: AC
  Administered 2015-02-02: 12.5 mg via INTRAVENOUS
  Filled 2015-02-02: qty 1

## 2015-02-02 MED ORDER — MIDAZOLAM HCL 2 MG/2ML IJ SOLN
INTRAMUSCULAR | Status: AC
  Filled 2015-02-02: qty 2

## 2015-02-02 SURGICAL SUPPLY — 18 items
BALLN TREK RX 2.25X12 (BALLOONS) ×3
BALLOON TREK RX 2.25X12 (BALLOONS) ×2 IMPLANT
CATH INFINITI 5FR ANG PIGTAIL (CATHETERS) ×3 IMPLANT
CATH INFINITI 5FR JL4 (CATHETERS) ×3 IMPLANT
CATH INFINITI JR4 5F (CATHETERS) ×3 IMPLANT
CATH VISTA GUIDE 6FR JR4 SH (CATHETERS) ×3 IMPLANT
DEVICE CLOSURE MYNXGRIP 6/7F (Vascular Products) ×3 IMPLANT
DEVICE INFLAT 30 PLUS (MISCELLANEOUS) ×3 IMPLANT
DEVICE SAFEGUARD 24CM (GAUZE/BANDAGES/DRESSINGS) ×6 IMPLANT
KIT MANI 3VAL PERCEP (MISCELLANEOUS) ×3 IMPLANT
NEEDLE PERC 18GX7CM (NEEDLE) ×3 IMPLANT
PACK CARDIAC CATH (CUSTOM PROCEDURE TRAY) ×3 IMPLANT
SHEATH AVANTI 5FR X 11CM (SHEATH) ×3 IMPLANT
SHEATH AVANTI 6FR X 11CM (SHEATH) ×3 IMPLANT
STENT XIENCE ALPINE RX 2.25X12 (Permanent Stent) ×3 IMPLANT
STENT XIENCE ALPINE RX 3.0X38 (Permanent Stent) ×3 IMPLANT
WIRE ASAHI PROWATER 180CM (WIRE) ×3 IMPLANT
WIRE EMERALD 3MM-J .035X150CM (WIRE) ×3 IMPLANT

## 2015-02-02 NOTE — Progress Notes (Signed)
Skin checked by Peyton Najjar RN

## 2015-02-02 NOTE — Progress Notes (Signed)
MD Paraschos was notified that BP was in the 80s. Pt was not responsive only to pain. 500cc bolus was started. Pt became more alert and began to recognize staff and family. NS @ 125 stop aggomax at 1:15am. PAD to come off at the same time.

## 2015-02-02 NOTE — Progress Notes (Signed)
ANTICOAGULATION CONSULT NOTE - Initial Consult  Pharmacy Consult for heparin drip monitoring and dosing Indication: chest pain/ACS  No Known Allergies  Patient Measurements: Height:  (175.3 cm) Weight: 131 lb 3.2 oz (59.512 kg) IBW/kg (Calculated) : 66.2 Heparin Dosing Weight: 65.8 kg  Vital Signs: Temp: 98.5 F (36.9 C) (09/13 1941) Temp Source: Oral (09/13 1941) BP: 121/64 mmHg (09/13 1941) Pulse Rate: 66 (09/13 1941)  Labs:  Recent Labs  02/01/15 1047 02/01/15 1720 02/01/15 2106 02/01/15 2253 02/02/15 0418  HGB 14.5  --   --   --  12.7  HCT 43.5  --   --   --  37.3  PLT 175  --   --   --  156  APTT 27  --   --   --   --   LABPROT 13.5  --   --   --   --   INR 1.01  --   --   --   --   HEPARINUNFRC  --  1.18* 0.95*  --  0.88*  CREATININE 1.18*  --   --   --  1.13*  TROPONINI 1.44* 2.29*  --  2.87* 2.53*    Estimated Creatinine Clearance: 46.6 mL/min (by C-G formula based on Cr of 1.13).   Medical History: History reviewed. No pertinent past medical history.  Medications:  Scheduled:  . aspirin  81 mg Oral Pre-Cath  . aspirin EC  81 mg Oral Daily  . Influenza vac split quadrivalent PF  0.5 mL Intramuscular Tomorrow-1000  . metoprolol tartrate  25 mg Oral BID  . omega-3 acid ethyl esters  1 g Oral Daily  . simvastatin  20 mg Oral q1800  . sodium chloride  3 mL Intravenous Q12H    Assessment: Pharmacy consulted to dose heparin drip for ACS/chest pain.   Goal of Therapy:  Heparin level 0.3-0.7 units/ml   Plan:  Give 4000 units bolus x 1 Start heparin infusion at 800 units/hr Check anti-Xa level in 6 hours and daily while on heparin Continue to monitor H&H and platelets   9/13:  HL @ 21:30 =  0.95 Will decrease rate to 700 units/hr and recheck HL on 9/14 @ 4:00.   0914 0418 heparin level supratherapeutic. Reduce to 600 units/hr and recheck in 6 hours.   Carola Frost, Pharm.D. Clinical Pharmacist 02/02/2015,5:01 AM

## 2015-02-02 NOTE — Progress Notes (Signed)
NSR. Room air. Pt was NPO before Cath. Takes meds ok. Family at the bedside. Heparin drip was d/c on call to cath lab. No pain. Pt has no further concerns at this time.

## 2015-02-02 NOTE — Progress Notes (Signed)
[facility] - [place] at [facility]   PATIENT NAME: Kristina Macias    MR#:  [id number]  DATE OF BIRTH:  03-Sep-1964  SUBJECTIVE:  CHIEF COMPLAINT:   Chief Complaint  Patient presents with  . Heartburn   - admitted for chest pain, elevated troponin - for cardiac cath today - currently no chest pain  REVIEW OF SYSTEMS:  Review of Systems  Constitutional: Negative for fever and chills.  Respiratory: Negative for cough, shortness of breath and wheezing.   Cardiovascular: Negative for chest pain and palpitations.  Gastrointestinal: Negative for nausea, vomiting, abdominal pain, diarrhea and constipation.  Genitourinary: Negative for dysuria.  Neurological: Negative for dizziness, seizures and headaches.    DRUG ALLERGIES:  No Known Allergies  VITALS:  Blood pressure 96/62, pulse 66, temperature 98.5 F (36.9 C), temperature source Oral, resp. rate 15, height 5\' 9"  (1.753 m), weight 59.421 kg (131 lb), SpO2 98 %.  PHYSICAL EXAMINATION:  Physical Exam  GENERAL:  64 y.o.-year-old patient lying in the bed with no acute distress.  EYES: Pupils equal, round, reactive to light and accommodation. No scleral icterus. Extraocular muscles intact.  HEENT: Head atraumatic, normocephalic. Oropharynx and nasopharynx clear.  NECK:  Supple, no jugular venous distention. No thyroid enlargement, no tenderness.  LUNGS: Normal breath sounds bilaterally, no wheezing, rales,rhonchi or crepitation. No use of accessory muscles of respiration.  CARDIOVASCULAR: S1, S2 normal. No murmurs, rubs, or gallops.  ABDOMEN: Soft, nontender, nondistended. Bowel sounds present. No organomegaly or mass.  EXTREMITIES: No pedal edema, cyanosis, or clubbing.  NEUROLOGIC: Cranial nerves II through XII are intact. Muscle strength 5/5 in all extremities. Sensation intact. Gait not checked.  PSYCHIATRIC: The patient is alert and oriented x 3.  SKIN: No obvious rash, lesion, or ulcer.     LABORATORY PANEL:   CBC  Recent Labs Lab 02/02/15 0418  WBC 5.8  HGB 12.7  HCT 37.3  PLT 156   ------------------------------------------------------------------------------------------------------------------  Chemistries   Recent Labs Lab 02/01/15 1047 02/02/15 0418  NA 142 142  K 3.6 4.0  CL 107 111  CO2 28 28  GLUCOSE 100* 93  BUN 16 15  CREATININE 1.18* 1.13*  CALCIUM 9.6 8.6*  AST 35  --   ALT 16  --   ALKPHOS 85  --   BILITOT 0.3  --    ------------------------------------------------------------------------------------------------------------------  Cardiac Enzymes  Recent Labs Lab 02/02/15 0418  TROPONINI 2.53*   ------------------------------------------------------------------------------------------------------------------  RADIOLOGY:  Dg Chest Portable 1 View  02/01/2015   CLINICAL DATA:  Acute chest pain for 2 days.  EXAM: PORTABLE CHEST - 1 VIEW  COMPARISON:  None.  FINDINGS: The cardiomediastinal silhouette is unremarkable.  There is no evidence of focal airspace disease, pulmonary edema, suspicious pulmonary nodule/mass, pleural effusion, or pneumothorax. No acute bony abnormalities are identified.  IMPRESSION: No active disease.   Electronically Signed   By: Harmon Pier M.D.   On: 02/01/2015 12:14    EKG:   Orders placed or performed during the hospital encounter of 02/01/15  . ED EKG  . ED EKG  . EKG 12-Lead  . EKG 12-Lead  . EKG 12-Lead  . EKG 12-Lead    ASSESSMENT AND PLAN:   64 year old female with no significant past medical history admitted for non-ST segment elevation MI  #1 non-STEMI- -Cardiac history, no previous past medical history -Admitted for chest pain and elevated troponin. -Started on IV heparin drip, cardiology consulted -For cardiac catheterization today -Started on aspirin and statin and  Toprol  #2 Hyperlipidemia-LDL greater than 170. With non-STEMI, will start on high-dose statin  #3 DVT  prophylaxis-on IV heparin for now   All the records are reviewed and case discussed with Care Management/Social Workerr. Management plans discussed with the patient, family and they are in agreement.  CODE STATUS: Full code  TOTAL TIME TAKING CARE OF THIS PATIENT: 37 minutes.   POSSIBLE D/C IN 1 DAYS, DEPENDING ON CLINICAL CONDITION.   Enid Baas M.D on 02/02/2064 at 2:51 PM  Between 7am to 6pm - Pager - [phone]  After 6pm go to [email or web address] - password [email or web address] [facility]  [place] [facility]  Office  [phone]  CC: Primary care physician; Barbette Merino, NP

## 2015-02-03 ENCOUNTER — Encounter: Payer: Self-pay | Admitting: Cardiology

## 2015-02-03 LAB — BASIC METABOLIC PANEL
Anion gap: 3 — ABNORMAL LOW (ref 5–15)
BUN: 11 mg/dL (ref 6–20)
CALCIUM: 7.9 mg/dL — AB (ref 8.9–10.3)
CO2: 26 mmol/L (ref 22–32)
CREATININE: 1.15 mg/dL — AB (ref 0.44–1.00)
Chloride: 112 mmol/L — ABNORMAL HIGH (ref 101–111)
GFR calc Af Amer: 57 mL/min — ABNORMAL LOW (ref >60)
GFR, EST NON AFRICAN AMERICAN: 49 mL/min — AB (ref >60)
GLUCOSE: 93 mg/dL (ref 65–99)
POTASSIUM: 3.7 mmol/L (ref 3.5–5.1)
SODIUM: 141 mmol/L (ref 135–145)

## 2015-02-03 MED ORDER — CLOPIDOGREL BISULFATE 75 MG PO TABS
75.0000 mg | ORAL_TABLET | Freq: Every day | ORAL | Status: DC
  Administered 2015-02-03: 75 mg via ORAL
  Filled 2015-02-03: qty 1

## 2015-02-03 MED ORDER — CLOPIDOGREL BISULFATE 75 MG PO TABS: 75.0000 mg | ORAL_TABLET | Freq: Every day | ORAL | Status: DC

## 2015-02-03 MED ORDER — ATORVASTATIN CALCIUM 80 MG PO TABS: 80.0000 mg | ORAL_TABLET | Freq: Every day | ORAL | Status: AC

## 2015-02-03 MED ORDER — METOPROLOL TARTRATE 25 MG PO TABS: 25.0000 mg | ORAL_TABLET | Freq: Two times a day (BID) | ORAL | Status: AC

## 2015-02-03 MED ORDER — NITROGLYCERIN 0.4 MG SL SUBL: 0.4000 mg | SUBLINGUAL_TABLET | SUBLINGUAL | Status: DC | PRN

## 2015-02-03 MED ORDER — ASPIRIN 81 MG PO TBEC: 81.0000 mg | DELAYED_RELEASE_TABLET | Freq: Every day | ORAL | Status: AC

## 2015-02-03 NOTE — Progress Notes (Signed)
NSR. Room air. Takes meds ok. PAD was removed and no bleeding or hematoma. NO pain. Tolerated diet well. A & O. Family at the bedside. IV and tele removed. Discharge instructions given to pt. Prescriptions given to pt. Pt has no further concerns at this time.

## 2015-02-03 NOTE — Care Management Important Message (Signed)
Important Message  Patient Details  Name: Kristina Macias MRN: [id number] Date of Birth: 1950/11/06   Medicare Important Message Given:  Yes-second notification given    Olegario Messier A Allmond 02/03/2015, 9:52 AM

## 2015-02-03 NOTE — Discharge Summary (Signed)
[facility] Physicians - [place] at [facility]   PATIENT NAME: Kristina Macias    MR#:  [id number]  DATE OF BIRTH:  Aug 31, 1950  DATE OF ADMISSION:  02/01/2015 ADMITTING PHYSICIAN: Adrian Saran, MD  DATE OF DISCHARGE: 02/03/15  PRIMARY CARE PHYSICIAN: Barbette Merino, NP    ADMISSION DIAGNOSIS:  Unstable angina [I20.0]  DISCHARGE DIAGNOSIS:  Active Problems:   NSTEMI (non-ST elevated myocardial infarction)   SECONDARY DIAGNOSIS:  History reviewed. No pertinent past medical history.  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 With non-STEMI, will start on high-dose statin  #3 Hypotension- after the catheterization, some femoral bleeding noted yesterday. Improved with fluid bolus - None today, no active bleeding now  DISCHARGE CONDITIONS:   Stable  CONSULTS OBTAINED:  Treatment Team:  Marcina Millard, MD  DRUG ALLERGIES:  No Known Allergies  DISCHARGE MEDICATIONS:   Current Discharge Medication List    START taking these medications   Details  aspirin EC 81 MG EC tablet Take 1 tablet (81 mg total) by mouth daily. Qty: 30 tablet, Refills: 2    atorvastatin (LIPITOR) 80 MG tablet Take 1 tablet (80 mg total) by mouth daily at 6 PM. Qty: 30 tablet, Refills: 2    clopidogrel (PLAVIX) 75 MG tablet Take 1 tablet (75 mg total) by mouth daily. Qty: 30 tablet, Refills: 2    metoprolol tartrate (LOPRESSOR) 25 MG tablet Take 1 tablet (25 mg total) by mouth  2 (two) times daily. Qty: 60 tablet, Refills: 2    nitroGLYCERIN (NITROSTAT) 0.4 MG SL tablet Place 1 tablet (0.4 mg total) under the tongue every 5 (five) minutes as needed for chest pain. Qty: 30 tablet, Refills: 2      CONTINUE these medications which have NOT CHANGED   Details  calcium-vitamin D (OSCAL WITH D) 500-200 MG-UNIT per tablet Take 1 tablet by mouth.    KRILL OIL OMEGA-3 PO Take 1 capsule by mouth daily.         DISCHARGE INSTRUCTIONS:   1. PCP f/u in 2-3 weeks 2. Cardiology f/u in 2 weeks  If you experience worsening of your admission symptoms, develop shortness of breath, life threatening emergency, suicidal or homicidal thoughts you must seek medical attention immediately by calling 911 or calling your MD immediately  if symptoms less severe.  You Must read complete instructions/literature along with all the possible adverse reactions/side effects for all the Medicines you take and that have been prescribed to you. Take any new Medicines after you have completely understood and accept all the possible adverse reactions/side effects.   Please note  You were cared for by a hospitalist during your hospital stay. If you have any questions about your discharge medications or the care you received while you were in the hospital after you are discharged, you can call the unit and asked to speak with the hospitalist on call if the hospitalist that took care of you is not available. Once you are discharged, your primary care physician will handle any further medical issues. Please note that NO REFILLS for any  discharge medications will be authorized once you are discharged, as it is imperative that you return to your primary care physician (or establish a relationship with a primary care physician if you do not have one) for your aftercare needs so that they can reassess your need for medications and monitor your lab values.    Today   CHIEF COMPLAINT:   Chief Complaint   Patient presents with  . Heartburn    VITAL SIGNS:  Blood pressure 115/56, pulse 83, temperature 97.9 F (36.6 C), temperature source Oral, resp. rate 18, height 5\' 9"  (1.753 m), weight 59.421 kg (131 lb), SpO2 100 %.  I/O:   Intake/Output Summary (Last 24 hours) at 02/03/15 1000 Last data filed at 02/03/15 0559  Gross per 24 hour  Intake      0 ml  Output    600 ml  Net   -600 ml    PHYSICAL EXAMINATION:   Physical Exam  GENERAL: 64 y.o.-year-old patient lying in the bed with no acute distress.  EYES: Pupils equal, round, reactive to light and accommodation. No scleral icterus. Extraocular muscles intact.  HEENT: Head atraumatic, normocephalic. Oropharynx and nasopharynx clear.  NECK: Supple, no jugular venous distention. No thyroid enlargement, no tenderness.  LUNGS: Normal breath sounds bilaterally, no wheezing, rales,rhonchi or crepitation. No use of accessory muscles of respiration.  CARDIOVASCULAR: S1, S2 normal. No murmurs, rubs, or gallops.  ABDOMEN: Soft, nontender, nondistended. Bowel sounds present. No organomegaly or mass.  EXTREMITIES: No pedal edema, cyanosis, or clubbing. right femoral PAD in place, no active bleeding seen NEUROLOGIC: Cranial nerves II through XII are intact. Muscle strength 5/5 in all extremities. Sensation intact. Gait not checked.  PSYCHIATRIC: The patient is alert and oriented x 3.  SKIN: No obvious rash, lesion, or ulcer.   DATA REVIEW:   CBC  Recent Labs Lab 02/02/15 0418  WBC 5.8  HGB 12.7  HCT 37.3  PLT 156    Chemistries   Recent Labs Lab 02/01/15 1047  02/03/15 0438  NA 142  < > 141  K 3.6  < > 3.7  CL 107  < > 112*  CO2 28  < > 26  GLUCOSE 100*  < > 93  BUN 16  < > 11  CREATININE 1.18*  < > 1.15*  CALCIUM 9.6  < > 7.9*  AST 35  --   --   ALT 16  --   --   ALKPHOS 85  --   --   BILITOT 0.3  --   --   < > = values in this interval not displayed.  Cardiac Enzymes  Recent Labs Lab 02/02/15 0418   TROPONINI 2.53*    Microbiology Results  No results found for this or any previous visit.  RADIOLOGY:  Dg Chest Portable 1 View  02/01/2015   CLINICAL DATA:  Acute chest pain for 2 days.  EXAM: PORTABLE CHEST - 1 VIEW  COMPARISON:  None.  FINDINGS: The cardiomediastinal silhouette is unremarkable.  There is no evidence of focal airspace disease, pulmonary edema, suspicious pulmonary nodule/mass, pleural effusion, or pneumothorax. No acute bony abnormalities are identified.  IMPRESSION: No active disease.   Electronically Signed   By: Harmon Pier M.D.   On: 02/01/2015 12:14    EKG:   Orders placed or performed during the hospital encounter of 02/01/15  . ED EKG  . ED EKG  . EKG 12-Lead  . EKG 12-Lead  . EKG 12-Lead  . EKG 12-Lead  Management plans discussed with the patient, family and they are in agreement.  CODE STATUS:     Code Status Orders        Start     Ordered   02/01/15 1624  Full code   Continuous     02/01/15 1623      TOTAL TIME TAKING CARE OF THIS PATIENT: 38 minutes.    Enid Baas M.D on 02/03/2015 at 10:00 AM  Between 7am to 6pm - Pager - [phone]  After 6pm go to [email or web address] - password [email or web address] [facility]  [place] [facility]  Office  [phone]  CC: Primary care physician; Barbette Merino, NP

## 2015-02-03 NOTE — Progress Notes (Signed)
[facility] Cardiology  SUBJECTIVE: I feel better   Filed Vitals:   02/02/15 1727 02/02/15 1949 02/03/15 0549 02/03/15 0726  BP: 107/58 109/57 105/59 115/56  Pulse: 67 70 77 83  Temp: 97.5 F (36.4 C)  97.9 F (36.6 C)   TempSrc: Oral     Resp: 18 19 18 18   Height:      Weight:      SpO2: 99% 100% 100% 100%     Intake/Output Summary (Last 24 hours) at 02/03/15 0842 Last data filed at 02/03/15 0559  Gross per 24 hour  Intake      0 ml  Output    600 ml  Net   -600 ml      PHYSICAL EXAM  General: Well developed, well nourished, in no acute distress HEENT:  Normocephalic and atramatic Neck:  No JVD.  Lungs: Clear bilaterally to auscultation and percussion. Heart: HRRR . Normal S1 and S2 without gallops or murmurs.  Abdomen: Bowel sounds are positive, abdomen soft and non-tender  Msk:  Back normal, normal gait. Normal strength and tone for age. Extremities: No clubbing, cyanosis or edema.   Neuro: Alert and oriented X 3. Psych:  Good affect, responds appropriately   LABS: Basic Metabolic Panel:  Recent Labs  16/10/96 0418 02/03/15 0438  NA 142 141  K 4.0 3.7  CL 111 112*  CO2 28 26  GLUCOSE 93 93  BUN 15 11  CREATININE 1.13* 1.15*  CALCIUM 8.6* 7.9*   Liver Function Tests:  Recent Labs  02/01/15 1047  AST 35  ALT 16  ALKPHOS 85  BILITOT 0.3  PROT 8.2*  ALBUMIN 4.4   No results for input(s): LIPASE, AMYLASE in the last 72 hours. CBC:  Recent Labs  02/01/15 1047 02/02/15 0418  WBC 6.3 5.8  NEUTROABS  --  3.2  HGB 14.5 12.7  HCT 43.5 37.3  MCV 97.6 96.4  PLT 175 156   Cardiac Enzymes:  Recent Labs  02/01/15 1720 02/01/15 2253 02/02/15 0418  TROPONINI 2.29* 2.87* 2.53*   BNP: Invalid input(s): POCBNP D-Dimer: No results for input(s): DDIMER in the last 72 hours. Hemoglobin A1C:  Recent Labs  02/01/15 1720  HGBA1C 5.6   Fasting Lipid Panel:  Recent Labs  02/02/15 0418  CHOL 237*  HDL 56  LDLCALC 172*  TRIG 47  CHOLHDL  4.2   Thyroid Function Tests: No results for input(s): TSH, T4TOTAL, T3FREE, THYROIDAB in the last 72 hours.  Invalid input(s): FREET3 Anemia Panel: No results for input(s): VITAMINB12, FOLATE, FERRITIN, TIBC, IRON, RETICCTPCT in the last 72 hours.  Dg Chest Portable 1 View  02/01/2015   CLINICAL DATA:  Acute chest pain for 2 days.  EXAM: PORTABLE CHEST - 1 VIEW  COMPARISON:  None.  FINDINGS: The cardiomediastinal silhouette is unremarkable.  There is no evidence of focal airspace disease, pulmonary edema, suspicious pulmonary nodule/mass, pleural effusion, or pneumothorax. No acute bony abnormalities are identified.  IMPRESSION: No active disease.   Electronically Signed   By: Harmon Pier M.D.   On: 02/01/2015 12:14     Echo normal left ventricular function with LVEF 55-65%  TELEMETRY: Normal sinus rhythm:  ASSESSMENT AND PLAN:  Active Problems:   NSTEMI (non-ST elevated myocardial infarction)    1. Non-STEMI 2. 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 Status post drug-eluting stent mid and distal RCA  Recommendations  1. Dual antiplatelet therapy uninterrupted for 1 year 2. Ambulate today 3. Discharge later today if  patient remains clinically stable   Kristina Bostic, MD, PhD, [facility] 02/03/2015 8:42 AM

## 2015-02-03 NOTE — Care Management (Signed)
Patient being discharge home on Plavix which will not be cost prohibitive.  No discharge needs

## 2015-02-12 ENCOUNTER — Emergency Department
Admission: EM | Admit: 2015-02-12 | Discharge: 2015-02-12 | Disposition: A | Discharge status: Home / Self Care | Payer: Medicare PPO | Attending: Emergency Medicine | Admitting: Emergency Medicine

## 2015-02-12 ENCOUNTER — Encounter: Payer: Self-pay | Admitting: Emergency Medicine

## 2015-02-12 DIAGNOSIS — Z7902 Long term (current) use of antithrombotics/antiplatelets: Secondary | ICD-10-CM | POA: Diagnosis not present

## 2015-02-12 DIAGNOSIS — R05 Cough: Secondary | ICD-10-CM | POA: Diagnosis not present

## 2015-02-12 DIAGNOSIS — Z79899 Other long term (current) drug therapy: Secondary | ICD-10-CM | POA: Insufficient documentation

## 2015-02-12 DIAGNOSIS — Z7982 Long term (current) use of aspirin: Secondary | ICD-10-CM | POA: Insufficient documentation

## 2015-02-12 DIAGNOSIS — R059 Cough, unspecified: Secondary | ICD-10-CM

## 2015-02-12 NOTE — ED Provider Notes (Signed)
[facility] Emergency Department Provider Note ____________________________________________  Time seen: 2020  I have reviewed the triage vital signs and the nursing notes.  HISTORY  Chief Complaint  URI  HPI Kristina Macias is a 64 y.o. female reports to the ED for evaluation of some now resolved cough with some nominal mucus production while she was napping earlier today. She is status post placement of 3 cardiacs debts about 10 days prior. She was seen by cardiologist yesterday, andreports no changes or worsening of her status. She does admit to being somewhat anxious since the procedure, for any noted changes to her health status. She describes that today she was taking a nap, and cough once or twice, producing some mucus and phlegm in the throat. She reports the phlegm was without blood, and otherwise scant amount. She reports here for evaluation and reassurance. She denies any interim fever, chills, sweats she does without shortness of breath, chest pain, or again ongoing cough.  No past medical history on file.  Patient Active Problem List   Diagnosis Date Noted  . NSTEMI (non-ST elevated myocardial infarction) 02/01/2015    Past Surgical History  Procedure Laterality Date  . Colon surgery    . Cardiac catheterization N/A 02/02/2015    Procedure: Left Heart Cath and Coronary Angiography;  Surgeon: Marcina Millard, MD;  Location: [facility] INVASIVE CV LAB;  Service: Cardiovascular;  Laterality: N/A;  . Cardiac catheterization N/A 02/02/2015    Procedure: Coronary Stent Intervention;  Surgeon: Marcina Millard, MD;  Location: [facility] INVASIVE CV LAB;  Service: Cardiovascular;  Laterality: N/A;  . Stents      Current Outpatient Rx  Name  Route  Sig  Dispense  Refill  . aspirin EC 81 MG EC tablet   Oral   Take 1 tablet (81 mg total) by mouth daily.   30 tablet   2   . atorvastatin (LIPITOR) 80 MG tablet   Oral   Take 1 tablet (80 mg total) by mouth daily  at 6 PM.   30 tablet   2   . calcium-vitamin D (OSCAL WITH D) 500-200 MG-UNIT per tablet   Oral   Take 1 tablet by mouth.         . clopidogrel (PLAVIX) 75 MG tablet   Oral   Take 1 tablet (75 mg total) by mouth daily.   30 tablet   2   . KRILL OIL OMEGA-3 PO   Oral   Take 1 capsule by mouth daily.         . metoprolol tartrate (LOPRESSOR) 25 MG tablet   Oral   Take 1 tablet (25 mg total) by mouth 2 (two) times daily.   60 tablet   2   . nitroGLYCERIN (NITROSTAT) 0.4 MG SL tablet   Sublingual   Place 1 tablet (0.4 mg total) under the tongue every 5 (five) minutes as needed for chest pain.   30 tablet   2    Allergies Review of patient's allergies indicates no known allergies.  No family history on file.  Social History Social History  Substance Use Topics  . Smoking status: Never Smoker   . Smokeless tobacco: None  . Alcohol Use: No   Review of Systems  Constitutional: Negative for fever. Eyes: Negative for visual changes. ENT: Negative for sore throat. Cardiovascular: Negative for chest pain. Respiratory: Negative for shortness of breath. Gastrointestinal: Negative for abdominal pain, vomiting and diarrhea. Genitourinary: Negative for dysuria. Musculoskeletal: Negative for back pain.  Skin: Negative for rash. Neurological: Negative for headaches, focal weakness or numbness. ____________________________________________  PHYSICAL EXAM:  VITAL SIGNS: ED Triage Vitals  Enc Vitals Group     BP 02/12/15 1827 147/65 mmHg     Pulse Rate 02/12/15 1827 86     Resp 02/12/15 1827 20     Temp 02/12/15 1827 98.5 F (36.9 C)     Temp src --      SpO2 02/12/15 1827 100 %     Weight 02/12/15 1827 135 lb (61.236 kg)     Height 02/12/15 1827  (1.753 m)     Head Cir --      Peak Flow --      Pain Score 02/12/15 2050 0     Pain Loc --      Pain Edu? --      Excl. in GC? --    Constitutional: Alert and oriented. Well appearing and in no  distress. Eyes: Conjunctivae are normal. PERRL. Normal extraocular movements. ENT   Head: Normocephalic and atraumatic.   Nose: No congestion/rhinorrhea.   Mouth/Throat: Mucous membranes are moist.   Neck: Supple. No thyromegaly. Hematological/Lymphatic/Immunological: No cervical lymphadenopathy. Cardiovascular: Normal rate, regular rhythm.  Respiratory: Normal respiratory effort. No wheezes/rales/rhonchi. Gastrointestinal: Soft and nontender. No distention. Musculoskeletal: Nontender with normal range of motion in all extremities.  Neurologic:  Normal gait without ataxia. Normal speech and language. No gross focal neurologic deficits are appreciated. Skin:  Skin is warm, dry and intact. No rash noted. Psychiatric: Mood and affect are normal. Patient exhibits appropriate insight and judgment. ____________________________________________  INITIAL IMPRESSION / ASSESSMENT AND PLAN / ED COURSE  Reassurance to the patient about her normal respiratory exam. Patient without any acute respiratory or cardiac symptoms. She is encouraged file the primary care provider, return to ED as needed for any worsening symptoms. ____________________________________________  FINAL CLINICAL IMPRESSION(S) / ED DIAGNOSES  Final diagnoses:  Cough      Lissa Hoard, PA-C 02/14/15 0126  Loleta Rose, MD 02/15/15 2113

## 2015-02-12 NOTE — ED Notes (Signed)
States phlegm in throat 2 days, denies fevers

## 2015-02-12 NOTE — Discharge Instructions (Signed)
Cough, Adult  A cough is a reflex. It helps you clear your throat and airways. A cough can help heal your body. A cough can last 2 or 3 weeks (acute) or may last more than 8 weeks (chronic). Some common causes of a cough can include an infection, allergy, or a cold. HOME CARE  Only take medicine as told by your doctor.  If given, take your medicines (antibiotics) as told. Finish them even if you start to feel better.  Use a cold steam vaporizer or humidifier in your home. This can help loosen thick spit (secretions).  Sleep so you are almost sitting up (semi-upright). Use pillows to do this. This helps reduce coughing.  Rest as needed.  Stop smoking if you smoke. GET HELP RIGHT AWAY IF:  You have yellowish-white fluid (pus) in your thick spit.  Your cough gets worse.  Your medicine does not reduce coughing, and you are losing sleep.  You cough up blood.  You have trouble breathing.  Your pain gets worse and medicine does not help.  You have a fever. MAKE SURE YOU:   Understand these instructions.  Will watch your condition.  Will get help right away if you are not doing well or get worse. Document Released: 01/18/2011 Document Revised: 09/21/2013 Document Reviewed: 01/18/2011 [facility] Patient Information 2015 [place], [place]. This information is not intended to replace advice given to you by your health care provider. Make sure you discuss any questions you have with your health care provider.  Your exam is normal. Consider dosing OTC cough medicine as needed. Follow-up with your provider as needed.

## 2015-02-12 NOTE — ED Notes (Signed)
Patient with no complaints at this time. Respirations even and unlabored. Skin warm/dry. Discharge instructions reviewed with patient at this time. Patient given opportunity to voice concerns/ask questions. Patient discharged at this time and left Emergency Department with steady gait.   

## 2015-03-01 ENCOUNTER — Encounter: Payer: Medicare PPO | Attending: Cardiology | Admitting: *Deleted

## 2015-03-01 ENCOUNTER — Encounter: Payer: Self-pay | Admitting: *Deleted

## 2015-03-01 VITALS — Ht 68.75 in | Wt 136.4 lb

## 2015-03-01 DIAGNOSIS — Z955 Presence of coronary angioplasty implant and graft: Secondary | ICD-10-CM | POA: Insufficient documentation

## 2015-03-01 DIAGNOSIS — I252 Old myocardial infarction: Secondary | ICD-10-CM | POA: Insufficient documentation

## 2015-03-02 NOTE — Progress Notes (Signed)
Cardiac Individual Treatment Plan  Patient Details  Name: Kristina Macias MRN: [id number] Date of Birth: [birth year] Referring Provider:  Marcina Millard, MD  Initial Encounter Date: Date: 03/01/15  Visit Diagnosis: S/P coronary artery stent placement  Status post myocardial infarction  Patient's Home Medications on Admission:  Current outpatient prescriptions:  .  aspirin EC 81 MG EC tablet, Take 1 tablet (81 mg total) by mouth daily., Disp: 30 tablet, Rfl: 2 .  atorvastatin (LIPITOR) 80 MG tablet, Take 1 tablet (80 mg total) by mouth daily at 6 PM., Disp: 30 tablet, Rfl: 2 .  calcium-vitamin D (OSCAL WITH D) 500-200 MG-UNIT per tablet, Take 1 tablet by mouth., Disp: , Rfl:  .  clopidogrel (PLAVIX) 75 MG tablet, Take 1 tablet (75 mg total) by mouth daily., Disp: 30 tablet, Rfl: 2 .  KRILL OIL OMEGA-3 PO, Take 1 capsule by mouth daily., Disp: , Rfl:  .  metoprolol tartrate (LOPRESSOR) 25 MG tablet, Take 1 tablet (25 mg total) by mouth 2 (two) times daily., Disp: 60 tablet, Rfl: 2 .  nitroGLYCERIN (NITROSTAT) 0.4 MG SL tablet, Place 1 tablet (0.4 mg total) under the tongue every 5 (five) minutes as needed for chest pain., Disp: 30 tablet, Rfl: 2  Past Medical History: History reviewed. No pertinent past medical history.  Tobacco Use: History  Smoking status  . Never Smoker   Smokeless tobacco  . Not on file    Labs: Recent Review Flowsheet Data    Labs for ITP Cardiac and Pulmonary Rehab Latest Ref Rng 02/01/2015 02/02/2015   Cholestrol 0 - 200 mg/dL - 401(U)   LDLCALC 0 - 99 mg/dL - 272(Z)   HDL >36 mg/dL - 56   Trlycerides <644 mg/dL - 47   Hemoglobin I3K 4.0 - 6.0 % 5.6 -       Exercise Target Goals: Date: 03/01/15  Exercise Program Goal: Individual exercise prescription set with THRR, safety & activity barriers. Participant demonstrates ability to understand and report RPE using BORG scale, to self-measure pulse accurately, and to acknowledge the importance of  the exercise prescription.  Exercise Prescription Goal: Starting with aerobic activity 30 plus minutes a day, 3 days per week for initial exercise prescription. Provide home exercise prescription and guidelines that participant acknowledges understanding prior to discharge.  Activity Barriers & Risk Stratification:     Activity Barriers & Risk Stratification - 03/01/15 1101    Activity Barriers & Risk Stratification   Activity Barriers None   Risk Stratification High      6 Minute Walk:     6 Minute Walk      03/01/15 1121       6 Minute Walk   Phase Initial     Distance 1900 feet     Walk Time 6 minutes     Resting HR 71 bpm     Resting BP 104/70 mmHg     Max Ex. HR 110 bpm     Max Ex. BP 120/60 mmHg     RPE 11     Symptoms No        Initial Exercise Prescription:     Initial Exercise Prescription - 03/01/15 1100    Date of Initial Exercise Prescription   Date 03/01/15   Treadmill   MPH 3   Grade 0   Minutes 15   Bike   Level 0.4   Minutes 15   Recumbant Bike   Level 5   RPM 45   Watts 40  Minutes 15   NuStep   Level 3   Watts 30   Minutes 15   Arm Ergometer   Level 1   Watts 10   Minutes 10   Arm/Foot Ergometer   Level 4   Watts 12   Minutes 10   Cybex   Level 3   RPM 50   Minutes 15   Recumbant Elliptical   Level 1   RPM 40   Watts 10   Minutes 15   Elliptical   Level 1   Speed 3   Minutes 5   REL-XR   Level 2   Watts 35   Minutes 15   Prescription Details   Frequency (times per week) 3   Duration Progress to 30 minutes of continuous aerobic without signs/symptoms of physical distress   Intensity   THRR REST +  30   Ratings of Perceived Exertion 11-15   Progression Continue progressive overload as per policy without signs/symptoms or physical distress.   Resistance Training   Training Prescription Yes   Weight 2   Reps 10-15      Exercise Prescription Changes:   Discharge Exercise Prescription (Final Exercise  Prescription Changes):   Nutrition:  Target Goals: Understanding of nutrition guidelines, daily intake of sodium 1500mg , cholesterol 200mg , calories 30% from fat and 7% or less from saturated fats, daily to have 5 or more servings of fruits and vegetables.  Biometrics:     Pre Biometrics - 03/01/15 1120    Pre Biometrics   Height 5' 8.75" (1.746 m)   Weight 136 lb 6.4 oz (61.871 kg)   Waist Circumference 26.25 inches   Hip Circumference 36.5 inches   Waist to Hip Ratio 0.72 %   BMI (Calculated) 20.3       Nutrition Therapy Plan and Nutrition Goals:     Nutrition Therapy & Goals - 03/02/15 1011    Nutrition Therapy   Drug/Food Interactions Statins/Certain Fruits   Intervention Plan   Intervention Using nutrition plan and personal goals to gain a healthy nutrition lifestyle. Add exercise as prescribed.      Nutrition Discharge: Rate Your Plate Scores:   Nutrition Goals Re-Evaluation:   Psychosocial: Target Goals: Acknowledge presence or absence of depression, maximize coping skills, provide positive support system. Participant is able to verbalize types and ability to use techniques and skills needed for reducing stress and depression.  Initial Review & Psychosocial Screening:     Initial Psych Review & Screening - 03/02/15 1011    Family Dynamics   Good Support System? Yes   Screening Interventions   Interventions Encouraged to exercise      Quality of Life Scores:   PHQ-9:     Recent Review Flowsheet Data    Depression screen [facility] 2/9 03/01/2015   Decreased Interest 0   Down, Depressed, Hopeless 0   PHQ - 2 Score 0   Altered sleeping 0   Tired, decreased energy 1   Change in appetite 1   Feeling bad or failure about yourself  0   Trouble concentrating 0   Moving slowly or fidgety/restless 0   Suicidal thoughts 0   PHQ-9 Score 2   Difficult doing work/chores Somewhat difficult      Psychosocial Evaluation and Intervention:   Psychosocial  Re-Evaluation:   Vocational Rehabilitation: Provide vocational rehab assistance to qualifying candidates.   Vocational Rehab Evaluation & Intervention:     Vocational Rehab - 03/01/15 1101    Initial Vocational Rehab  Evaluation & Intervention   Assessment shows need for Vocational Rehabilitation No      Education: Education Goals: Education classes will be provided on a weekly basis, covering required topics. Participant will state understanding/return demonstration of topics presented.  Learning Barriers/Preferences:     Learning Barriers/Preferences - 03/01/15 1101    Learning Barriers/Preferences   Learning Barriers None   Learning Preferences None      Education Topics: General Nutrition Guidelines/Fats and Fiber: -Group instruction provided by verbal, written material, models and posters to present the general guidelines for heart healthy nutrition. Gives an explanation and review of dietary fats and fiber.   Controlling Sodium/Reading Food Labels: -Group verbal and written material supporting the discussion of sodium use in heart healthy nutrition. Review and explanation with models, verbal and written materials for utilization of the food label.   Exercise Physiology & Risk Factors: - Group verbal and written instruction with models to review the exercise physiology of the cardiovascular system and associated critical values. Details cardiovascular disease risk factors and the goals associated with each risk factor.   Aerobic Exercise & Resistance Training: - Gives group verbal and written discussion on the health impact of inactivity. On the components of aerobic and resistive training programs and the benefits of this training and how to safely progress through these programs.   Flexibility, Balance, General Exercise Guidelines: - Provides group verbal and written instruction on the benefits of flexibility and balance training programs. Provides general exercise  guidelines with specific guidelines to those with heart or lung disease. Demonstration and skill practice provided.   Stress Management: - Provides group verbal and written instruction about the health risks of elevated stress, cause of high stress, and healthy ways to reduce stress.   Depression: - Provides group verbal and written instruction on the correlation between heart/lung disease and depressed mood, treatment options, and the stigmas associated with seeking treatment.   Anatomy & Physiology of the Heart: - Group verbal and written instruction and models provide basic cardiac anatomy and physiology, with the coronary electrical and arterial systems. Review of: AMI, Angina, Valve disease, Heart Failure, Cardiac Arrhythmia, Pacemakers, and the ICD.   Cardiac Procedures: - Group verbal and written instruction and models to describe the testing methods done to diagnose heart disease. Reviews the outcomes of the test results. Describes the treatment choices: Medical Management, Angioplasty, or Coronary Bypass Surgery.   Cardiac Medications: - Group verbal and written instruction to review commonly prescribed medications for heart disease. Reviews the medication, class of the drug, and side effects. Includes the steps to properly store meds and maintain the prescription regimen.   Go Sex-Intimacy & Heart Disease, Get SMART - Goal Setting: - Group verbal and written instruction through game format to discuss heart disease and the return to sexual intimacy. Provides group verbal and written material to discuss and apply goal setting through the application of the S.M.A.R.T. Method.   Other Matters of the Heart: - Provides group verbal, written materials and models to describe Heart Failure, Angina, Valve Disease, and Diabetes in the realm of heart disease. Includes description of the disease process and treatment options available to the cardiac patient.   Exercise & Equipment  Safety: - Individual verbal instruction and demonstration of equipment use and safety with use of the equipment.          [facility]   Date  03/01/15   Educator  C. Marton Malizia,RN   Instruction Review Code  1- partially meets, needs review/practice      Infection Prevention: - Provides verbal and written material to individual with discussion of infection control including proper hand washing and proper equipment cleaning during exercise session.      Cardiac Rehab from 03/01/2015 in [facility]   Date  03/01/15   Educator  C. EnterkinRN   Instruction Review Code  1- partially meets, needs review/practice      Falls Prevention: - Provides verbal and written material to individual with discussion of falls prevention and safety.      Cardiac Rehab from 03/01/2015 in [facility]   Date  03/01/15   Educator  C. EnterkinRN   Instruction Review Code  1- partially meets, needs review/practice      Diabetes: - Individual verbal and written instruction to review signs/symptoms of diabetes, desired ranges of glucose level fasting, after meals and with exercise. Advice that pre and post exercise glucose checks will be done for 3 sessions at entry of program.    Knowledge Questionnaire Score:   Personal Goals and Risk Factors at Admission:   Personal Goals and Risk Factors Review:    Personal Goals Discharge (Final Personal Goals and Risk Factors Review):     Comments: New admission to Cardiac Rehab and 30 day note day today.

## 2015-03-02 NOTE — Patient Instructions (Signed)
Patient Instructions  Patient Details  Name: Kristina Macias MRN: [id number] Date of Birth: 23-Jun-1950 Referring Provider:  Marcina MillardParaschos, Alexander, MD  Below are the personal goals you chose as well as exercise and nutrition goals. Our goal is to help you keep on track towards obtaining and maintaining your goals. We will be discussing your progress on these goals with you throughout the program.  Initial Exercise Prescription:     Initial Exercise Prescription - 03/01/15 1100    Date of Initial Exercise Prescription   Date 03/01/15   Treadmill   MPH 3   Grade 0   Minutes 15   Bike   Level 0.4   Minutes 15   Recumbant Bike   Level 5   RPM 45   Watts 40   Minutes 15   NuStep   Level 3   Watts 30   Minutes 15   Arm Ergometer   Level 1   Watts 10   Minutes 10   Arm/Foot Ergometer   Level 4   Watts 12   Minutes 10   Cybex   Level 3   RPM 50   Minutes 15   Recumbant Elliptical   Level 1   RPM 40   Watts 10   Minutes 15   Elliptical   Level 1   Speed 3   Minutes 5   REL-XR   Level 2   Watts 35   Minutes 15   Prescription Details   Frequency (times per week) 3   Duration Progress to 30 minutes of continuous aerobic without signs/symptoms of physical distress   Intensity   THRR REST +  30   Ratings of Perceived Exertion 11-15   Progression Continue progressive overload as per policy without signs/symptoms or physical distress.   Resistance Training   Training Prescription Yes   Weight 2   Reps 10-15      Exercise Goals: Frequency: Be able to perform aerobic exercise three times per week working toward 3-5 days per week.  Intensity: Work with a perceived exertion of 11 (fairly light) - 15 (hard) as tolerated. Follow your new exercise prescription and watch for changes in prescription as you progress with the program. Changes will be reviewed with you when they are made.  Duration: You should be able to do 30 minutes of continuous aerobic exercise in  addition to a 5 minute warm-up and a 5 minute cool-down routine.  Nutrition Goals: Your personal nutrition goals will be established when you do your nutrition analysis with the dietician.  The following are nutrition guidelines to follow: Cholesterol < 200mg /day Sodium < 1500mg /day Fiber: Women over 50 yrs - 21 grams per day  Personal Goals:   Tobacco Use Initial Evaluation: History  Smoking status  . Never Smoker   Smokeless tobacco  . Not on file    Copy of goals given to participant.

## 2015-03-07 ENCOUNTER — Encounter: Payer: Medicare PPO | Admitting: *Deleted

## 2015-03-07 DIAGNOSIS — Z955 Presence of coronary angioplasty implant and graft: Secondary | ICD-10-CM

## 2015-03-07 NOTE — Progress Notes (Signed)
Daily Session Note  Patient Details  Name: Kristina Macias MRN: [id number] Date of Birth: Nov 06, 1950 Referring Provider:  Isaias Cowman, MD  Encounter Date: 03/07/2015  Check In:     Session Check In - 03/07/15 0818    Check-In   Staff Present Candiss Norse MS, ACSM CEP Exercise Physiologist;Kelly Alfonso Patten, ACSM CEP Exercise Physiologist;Susanne Bice RN, BSN, [place]   ER physicians immediately available to respond to emergencies See telemetry face sheet for immediately available ER MD   Medication changes reported     No   Fall or balance concerns reported    No   Warm-up and Cool-down Performed on first and last piece of equipment   VAD Patient? No   Pain Assessment   Currently in Pain? No/denies   Multiple Pain Sites No           Exercise Prescription Changes - 03/07/15 0800    Response to Exercise   Symptoms None   Comments First day of exercise! Patient was oriented to the gym and the equipment functions and settings. Procedures and policies of the gym were outlined and explained. The patient's individual exercise prescription and treatment plan were reviewed with them. All starting workloads were established based on the results of the functional testing  done at the initial intake visit. The plan for exercise progression was also introduced and progression will be customized based on the patient's performance and goals.    Duration Progress to 30 minutes of continuous aerobic without signs/symptoms of physical distress   Intensity Rest + 30   Progression Continue progressive overload as per policy without signs/symptoms or physical distress.   Resistance Training   Training Prescription Yes   Weight 2   Reps 10-15   Interval Training   Interval Training No      Goals Met:  Independence with exercise equipment Exercise tolerated well Personal goals reviewed No report of cardiac concerns or symptoms Strength training completed today  Goals Unmet:  Not  Applicable  Goals Comments: First day of exercise! See goals above for comments on her first day.    Dr. Emily Filbert is Medical Director for [place] and [facility].

## 2015-03-09 DIAGNOSIS — Z955 Presence of coronary angioplasty implant and graft: Secondary | ICD-10-CM

## 2015-03-09 NOTE — Progress Notes (Signed)
Daily Session Note  Patient Details  Name: Kristina Macias MRN: [id number] Date of Birth: [birth year] Referring Provider:  Isaias Cowman, MD  Encounter Date: 03/09/2015  Check In:     Session Check In - 03/09/15 0908    Check-In   Staff Present Nyoka Cowden RN;Renee Wilton MS, ACSM CEP Exercise Physiologist;Josefina Rynders BS, ACSM EP-C, Exercise Physiologist   ER physicians immediately available to respond to emergencies See telemetry face sheet for immediately available ER MD   Medication changes reported     No   Fall or balance concerns reported    No   Warm-up and Cool-down Performed on first and last piece of equipment   VAD Patient? No   Pain Assessment   Currently in Pain? No/denies         Goals Met:  Proper associated with RPD/PD & O2 Sat Exercise tolerated well No report of cardiac concerns or symptoms Strength training completed today  Goals Unmet:  Not Applicable  Goals Comments:    Dr. Emily Filbert is Medical Director for [place] and [facility].

## 2015-03-11 ENCOUNTER — Encounter: Payer: Medicare PPO | Admitting: *Deleted

## 2015-03-11 DIAGNOSIS — I252 Old myocardial infarction: Secondary | ICD-10-CM

## 2015-03-11 DIAGNOSIS — Z955 Presence of coronary angioplasty implant and graft: Secondary | ICD-10-CM | POA: Diagnosis not present

## 2015-03-11 NOTE — Progress Notes (Signed)
Cardiac Individual Treatment Plan  Patient Details  Name: Kristina Macias MRN: [id number] Date of Birth: 04/24/1951 Referring Provider:  Isaias Cowman, MD  Initial Encounter Date:    Visit Diagnosis: S/P coronary artery stent placement  Status post myocardial infarction  Patient's Home Medications on Admission:  Current outpatient prescriptions:  .  aspirin EC 81 MG EC tablet, Take 1 tablet (81 mg total) by mouth daily., Disp: 30 tablet, Rfl: 2 .  atorvastatin (LIPITOR) 80 MG tablet, Take 1 tablet (80 mg total) by mouth daily at 6 PM., Disp: 30 tablet, Rfl: 2 .  calcium-vitamin D (OSCAL WITH D) 500-200 MG-UNIT per tablet, Take 1 tablet by mouth., Disp: , Rfl:  .  clopidogrel (PLAVIX) 75 MG tablet, Take 1 tablet (75 mg total) by mouth daily., Disp: 30 tablet, Rfl: 2 .  KRILL OIL OMEGA-3 PO, Take 1 capsule by mouth daily., Disp: , Rfl:  .  metoprolol tartrate (LOPRESSOR) 25 MG tablet, Take 1 tablet (25 mg total) by mouth 2 (two) times daily., Disp: 60 tablet, Rfl: 2 .  nitroGLYCERIN (NITROSTAT) 0.4 MG SL tablet, Place 1 tablet (0.4 mg total) under the tongue every 5 (five) minutes as needed for chest pain., Disp: 30 tablet, Rfl: 2  Past Medical History: No past medical history on file.  Tobacco Use: History  Smoking status  . Never Smoker   Smokeless tobacco  . Not on file    Labs: Recent Review Flowsheet Data    Labs for ITP Cardiac and Pulmonary Rehab Latest Ref Rng 02/01/2015 02/02/2015   Cholestrol 0 - 200 mg/dL - 237(H)   LDLCALC 0 - 99 mg/dL - 172(H)   HDL >40 mg/dL - 56   Trlycerides <150 mg/dL - 47   Hemoglobin A1c 4.0 - 6.0 % 5.6 -       Exercise Target Goals:    Exercise Program Goal: Individual exercise prescription set with THRR, safety & activity barriers. Participant demonstrates ability to understand and report RPE using BORG scale, to self-measure pulse accurately, and to acknowledge the importance of the exercise prescription.  Exercise  Prescription Goal: Starting with aerobic activity 30 plus minutes a day, 3 days per week for initial exercise prescription. Provide home exercise prescription and guidelines that participant acknowledges understanding prior to discharge.  Activity Barriers & Risk Stratification:     Activity Barriers & Risk Stratification - 03/01/15 1101    Activity Barriers & Risk Stratification   Activity Barriers None   Risk Stratification High      6 Minute Walk:     6 Minute Walk      03/01/15 1121       6 Minute Walk   Phase Initial     Distance 1900 feet     Walk Time 6 minutes     Resting HR 71 bpm     Resting BP 104/70 mmHg     Max Ex. HR 110 bpm     Max Ex. BP 120/60 mmHg     RPE 11     Symptoms No        Initial Exercise Prescription:     Initial Exercise Prescription - 03/01/15 1100    Date of Initial Exercise Prescription   Date 03/01/15   Treadmill   MPH 3   Grade 0   Minutes 15   Bike   Level 0.4   Minutes 15   Recumbant Bike   Level 5   RPM 45   Watts 40  Minutes 15   NuStep   Level 3   Watts 30   Minutes 15   Arm Ergometer   Level 1   Watts 10   Minutes 10   Arm/Foot Ergometer   Level 4   Watts 12   Minutes 10   Cybex   Level 3   RPM 50   Minutes 15   Recumbant Elliptical   Level 1   RPM 40   Watts 10   Minutes 15   Elliptical   Level 1   Speed 3   Minutes 5   REL-XR   Level 2   Watts 35   Minutes 15   Prescription Details   Frequency (times per week) 3   Duration Progress to 30 minutes of continuous aerobic without signs/symptoms of physical distress   Intensity   THRR REST +  30   Ratings of Perceived Exertion 11-15   Progression Continue progressive overload as per policy without signs/symptoms or physical distress.   Resistance Training   Training Prescription Yes   Weight 2   Reps 10-15      Exercise Prescription Changes:     Exercise Prescription Changes      03/07/15 0800           Response to Exercise    Symptoms None       Comments First day of exercise! Patient was oriented to the gym and the equipment functions and settings. Procedures and policies of the gym were outlined and explained. The patient's individual exercise prescription and treatment plan were reviewed with them. All starting workloads were established based on the results of the functional testing  done at the initial intake visit. The plan for exercise progression was also introduced and progression will be customized based on the patient's performance and goals.        Duration Progress to 30 minutes of continuous aerobic without signs/symptoms of physical distress       Intensity Rest + 30       Progression Continue progressive overload as per policy without signs/symptoms or physical distress.       Resistance Training   Training Prescription Yes       Weight 2       Reps 10-15       Interval Training   Interval Training No          Discharge Exercise Prescription (Final Exercise Prescription Changes):     Exercise Prescription Changes - 03/07/15 0800    Response to Exercise   Symptoms None   Comments First day of exercise! Patient was oriented to the gym and the equipment functions and settings. Procedures and policies of the gym were outlined and explained. The patient's individual exercise prescription and treatment plan were reviewed with them. All starting workloads were established based on the results of the functional testing  done at the initial intake visit. The plan for exercise progression was also introduced and progression will be customized based on the patient's performance and goals.    Duration Progress to 30 minutes of continuous aerobic without signs/symptoms of physical distress   Intensity Rest + 30   Progression Continue progressive overload as per policy without signs/symptoms or physical distress.   Resistance Training   Training Prescription Yes   Weight 2   Reps 10-15   Interval Training    Interval Training No      Nutrition:  Target Goals: Understanding of nutrition guidelines, daily intake of sodium <  1552m, cholesterol <2051m calories 30% from fat and 7% or less from saturated fats, daily to have 5 or more servings of fruits and vegetables.  Biometrics:     Pre Biometrics - 03/01/15 1120    Pre Biometrics   Height 5' 8.75" (1.746 m)   Weight 136 lb 6.4 oz (61.871 kg)   Waist Circumference 26.25 inches   Hip Circumference 36.5 inches   Waist to Hip Ratio 0.72 %   BMI (Calculated) 20.3       Nutrition Therapy Plan and Nutrition Goals:     Nutrition Therapy & Goals - 03/02/15 1011    Nutrition Therapy   Drug/Food Interactions Statins/Certain Fruits   Intervention Plan   Intervention Using nutrition plan and personal goals to gain a healthy nutrition lifestyle. Add exercise as prescribed.      Nutrition Discharge: Rate Your Plate Scores:   Nutrition Goals Re-Evaluation:   Psychosocial: Target Goals: Acknowledge presence or absence of depression, maximize coping skills, provide positive support system. Participant is able to verbalize types and ability to use techniques and skills needed for reducing stress and depression.  Initial Review & Psychosocial Screening:     Initial Psych Review & Screening - 03/02/15 [id number]   Screening Interventions   Interventions Encouraged to exercise      Quality of Life Scores:     Quality of Life - 03/01/15 1521    Quality of Life Scores   Health/Function Pre 24.67 %   Socioeconomic Pre 25.17 %   Psych/Spiritual Pre 26.71 %   Family Pre 30 %   GLOBAL Pre 26 %      PHQ-9:     Recent Review Flowsheet Data    [facility] 03/03/2015 03/01/2015   Decreased Interest 0 0   Down, Depressed, Hopeless 0 0   PHQ - 2 Score 0 0   Altered sleeping 0 0   Tired, decreased energy 1 1   Change in appetite 1 1   Feeling bad or failure about yourself  0 0    Trouble concentrating 0 0   Moving slowly or fidgety/restless 0 0   Suicidal thoughts 0 0   PHQ-9 Score 2 2   Difficult doing work/chores Somewhat difficult Somewhat difficult      Psychosocial Evaluation and Intervention:     Psychosocial Evaluation - 03/07/15 1014    Psychosocial Evaluation & Interventions   Interventions Encouraged to exercise with the program and follow exercise prescription;Relaxation education;Stress management education   Comments Counselor met with Ms. DaCostenoday for initial psychosocial evaluation.  She is a [age]ear old who experienced a heart attack and stents inserted approximately one month ago.  Ms. DaPodolskias a strong support system with a spouse of [id number] and other family and friends who live closeby.  She reports being healthy for the most part and sleeps well.  Her appetite has improved recently as well once she became educated on what to eat now that she has had the heart attack.  Ms. DaHerardenies a history of anxiety or depression or current symptoms.  She states she is typically in a positive mood.   She will benefit from meeting with the dietician and is on the calendar to do so.  Ms. DaLauxould like to be better educated on healthful eating and to increase her strength while in this program.     Continued Psychosocial Services Needed Yes  Ms. Mcelhiney will benefit from the psychoeducational components of this program, especially stress management and relaxation.  She also is scheduled to meet with the dietician to help with her concerns about healthier eating.        Psychosocial Re-Evaluation:   Vocational Rehabilitation: Provide vocational rehab assistance to qualifying candidates.   Vocational Rehab Evaluation & Intervention:     Vocational Rehab - 03/01/15 1101    Initial Vocational Rehab Evaluation & Intervention   Assessment shows need for Vocational Rehabilitation No      Education: Education Goals: Education classes will be  provided on a weekly basis, covering required topics. Participant will state understanding/return demonstration of topics presented.  Learning Barriers/Preferences:     Learning Barriers/Preferences - 03/01/15 1101    Learning Barriers/Preferences   Learning Barriers None   Learning Preferences None      Education Topics: General Nutrition Guidelines/Fats and Fiber: -Group instruction provided by verbal, written material, models and posters to present the general guidelines for heart healthy nutrition. Gives an explanation and review of dietary fats and fiber.   Controlling Sodium/Reading Food Labels: -Group verbal and written material supporting the discussion of sodium use in heart healthy nutrition. Review and explanation with models, verbal and written materials for utilization of the food label.   Exercise Physiology & Risk Factors: - Group verbal and written instruction with models to review the exercise physiology of the cardiovascular system and associated critical values. Details cardiovascular disease risk factors and the goals associated with each risk factor.   Aerobic Exercise & Resistance Training: - Gives group verbal and written discussion on the health impact of inactivity. On the components of aerobic and resistive training programs and the benefits of this training and how to safely progress through these programs.   Flexibility, Balance, General Exercise Guidelines: - Provides group verbal and written instruction on the benefits of flexibility and balance training programs. Provides general exercise guidelines with specific guidelines to those with heart or lung disease. Demonstration and skill practice provided.   Stress Management: - Provides group verbal and written instruction about the health risks of elevated stress, cause of high stress, and healthy ways to reduce stress.   Depression: - Provides group verbal and written instruction on the correlation  between heart/lung disease and depressed mood, treatment options, and the stigmas associated with seeking treatment.   Anatomy & Physiology of the Heart: - Group verbal and written instruction and models provide basic cardiac anatomy and physiology, with the coronary electrical and arterial systems. Review of: AMI, Angina, Valve disease, Heart Failure, Cardiac Arrhythmia, Pacemakers, and the ICD.   Cardiac Procedures: - Group verbal and written instruction and models to describe the testing methods done to diagnose heart disease. Reviews the outcomes of the test results. Describes the treatment choices: Medical Management, Angioplasty, or Coronary Bypass Surgery.          Cardiac Rehab from 03/07/2015 in [facility] Cardiac Rehab   Date  03/07/15   Educator  SB   Instruction Review Code  2- meets goals/outcomes      Cardiac Medications: - Group verbal and written instruction to review commonly prescribed medications for heart disease. Reviews the medication, class of the drug, and side effects. Includes the steps to properly store meds and maintain the prescription regimen.   Go Sex-Intimacy & Heart Disease, Get SMART - Goal Setting: - Group verbal and written instruction through game format to discuss heart disease and the return to sexual intimacy. Provides group verbal  and written material to discuss and apply goal setting through the application of the S.M.A.R.T. Method.      Cardiac Rehab from 03/07/2015 in [facility] Cardiac Rehab   Date  03/07/15   Educator  SB   Instruction Review Code  2- meets goals/outcomes      Other Matters of the Heart: - Provides group verbal, written materials and models to describe Heart Failure, Angina, Valve Disease, and Diabetes in the realm of heart disease. Includes description of the disease process and treatment options available to the cardiac patient.   Exercise & Equipment Safety: - Individual verbal instruction and demonstration of equipment use and  safety with use of the equipment.      Cardiac Rehab from 03/07/2015 in [facility] Cardiac Rehab   Date  03/01/15   Educator  C. Breleigh Carpino,RN   Instruction Review Code  1- partially meets, needs review/practice      Infection Prevention: - Provides verbal and written material to individual with discussion of infection control including proper hand washing and proper equipment cleaning during exercise session.      Cardiac Rehab from 03/07/2015 in [facility] Cardiac Rehab   Date  03/01/15   Educator  C. [place]   Instruction Review Code  1- partially meets, needs review/practice      Falls Prevention: - Provides verbal and written material to individual with discussion of falls prevention and safety.      Cardiac Rehab from 03/07/2015 in [facility] Cardiac Rehab   Date  03/01/15   Educator  C. [place]   Instruction Review Code  1- partially meets, needs review/practice      Diabetes: - Individual verbal and written instruction to review signs/symptoms of diabetes, desired ranges of glucose level fasting, after meals and with exercise. Advice that pre and post exercise glucose checks will be done for 3 sessions at entry of program.    Knowledge Questionnaire Score:   Personal Goals and Risk Factors at Admission:     Personal Goals and Risk Factors at Admission - 03/01/15 1039    Personal Goals and Risk Factors on Admission   Increase Aerobic Exercise and Physical Activity Yes   Intervention While in program, learn and follow the exercise prescription taught. Start at a low level workload and increase workload after able to maintain previous level for 30 minutes. Increase time before increasing intensity.   Diabetes No   Hypertension No   Lipids Yes   Goal Cholesterol controlled with medications as prescribed, with individualized exercise RX and with personalized nutrition plan. Value goals: LDL < 13m, HDL > 41m Participant states understanding of desired cholesterol values and  following prescriptions.   Intervention Provide nutrition & aerobic exercise along with prescribed medications to achieve LDL <7058mHDL >34m90m    Personal Goals and Risk Factors Review:      Goals and Risk Factor Review      03/11/15 0927           Increase Aerobic Exercise and Physical Activity   Goals Progress/Improvement seen  Yes       Comments JaniJalisiates doing well with the exercise and that it helps her in her daily activity. She is exercising Tuesday and Thursday at home 30-6- minutes on a stationary bike. RPE 13.          Abnormal Lipids   Progress seen towards goals Yes       Comments JaniTerrilon medication for cholesterol control. She is working on daily  nutrition control of saturated fats and sodium. Has an appointment with the RD next week.           Personal Goals Discharge (Final Personal Goals and Risk Factors Review):      Goals and Risk Factor Review - 03/11/15 0927    Increase Aerobic Exercise and Physical Activity   Goals Progress/Improvement seen  Yes   Comments Jahyra states doing well with the exercise and that it helps her in her daily activity. She is exercising Tuesday and Thursday at home 30-6- minutes on a stationary bike. RPE 13.      Abnormal Lipids   Progress seen towards goals Yes   Comments Latesia is on medication for cholesterol control. She is working on daily nutrition control of saturated fats and sodium. Has an appointment with the RD next week.        Comments: Doing well in Cardiac Rehab plus Zahriyah is exercising at home.

## 2015-03-11 NOTE — Progress Notes (Signed)
Daily Session Note  Patient Details  Name: Kristina Macias MRN: [id number] Date of Birth: [birth year] Referring Provider:  Isaias Cowman, MD  Encounter Date: 03/11/2015  Check In:     Session Check In - 03/11/15 0926    Check-In   Staff Present Heath Lark RN, BSN, CCRP;Renee Dillard Essex MS, ACSM CEP Exercise Physiologist;Carroll Enterkin RN, BSN   ER physicians immediately available to respond to emergencies See telemetry face sheet for immediately available ER MD   Medication changes reported     No   Fall or balance concerns reported    No   Warm-up and Cool-down Performed on first and last piece of equipment   VAD Patient? No   Pain Assessment   Currently in Pain? No/denies         Goals Met:  Exercise tolerated well No report of cardiac concerns or symptoms Strength training completed today  Goals Unmet:  Not Applicable  Goals Comments: [name] is new to the program. She states she is enjoying the exercise and program. Goals reviewed today.   Dr. Emily Filbert is Medical Director for [place] and [facility].

## 2015-03-11 NOTE — Addendum Note (Signed)
Addended by: Virgina OrganENTERKIN, Jennfer Gassen on: 03/11/2015 10:42 AM   Modules accepted: Orders

## 2015-03-14 ENCOUNTER — Encounter: Payer: Medicare PPO | Admitting: *Deleted

## 2015-03-14 DIAGNOSIS — Z955 Presence of coronary angioplasty implant and graft: Secondary | ICD-10-CM | POA: Diagnosis not present

## 2015-03-14 NOTE — Progress Notes (Signed)
Daily Session Note  Patient Details  Name: ANTIGONE CROWELL MRN: [id number] Date of Birth: 05-Oct-1950 Referring Provider:  Isaias Cowman, MD  Encounter Date: 03/14/2015  Check In:     Session Check In - 03/14/15 0821    Check-In   Staff Present Candiss Norse MS, ACSM CEP Exercise Physiologist;Susanne Bice RN, BSN, CCRP;Kelly Alfonso Patten, ACSM CEP Exercise Physiologist   ER physicians immediately available to respond to emergencies See telemetry face sheet for immediately available ER MD   Medication changes reported     No   Fall or balance concerns reported    No   Warm-up and Cool-down Performed on first and last piece of equipment   VAD Patient? No   Pain Assessment   Currently in Pain? No/denies   Multiple Pain Sites No         Goals Met:  Independence with exercise equipment Exercise tolerated well No report of cardiac concerns or symptoms Strength training completed today  Goals Unmet:  Not Applicable  Goals Comments: Patient completed exercise prescription and all exercise goals during rehab session. The exercise was tolerated well and the patient is progressing in the program.    Dr. Emily Filbert is Medical Director for [place] and [facility].

## 2015-03-16 DIAGNOSIS — Z955 Presence of coronary angioplasty implant and graft: Secondary | ICD-10-CM | POA: Diagnosis not present

## 2015-03-16 NOTE — Progress Notes (Signed)
Daily Session Note  Patient Details  Name: Kristina Macias MRN: [id number] Date of Birth: 1950/09/06 Referring Provider:  Isaias Cowman, MD  Encounter Date: 03/16/2015  Check In:     Session Check In - 03/16/15 0821    Check-In   Staff Present Candiss Norse MS, ACSM CEP Exercise Physiologist;Steven Way BS, ACSM EP-C, Exercise Physiologist;Susanne Bice RN, BSN, CCRP   ER physicians immediately available to respond to emergencies See telemetry face sheet for immediately available ER MD   Medication changes reported     No   Fall or balance concerns reported    No   Warm-up and Cool-down Performed on first and last piece of equipment   VAD Patient? No   Pain Assessment   Currently in Pain? No/denies   Multiple Pain Sites No           Exercise Prescription Changes - 03/16/15 0800    Exercise Review   Progression Yes   Response to Exercise   Symptoms None   Comments Reviewed individualized exercise prescription and made increases per departmental policy. Exercise increases were discussed with the patient and they were able to perform the new work loads without issue (no signs or symptoms).    Duration Progress to 30 minutes of continuous aerobic without signs/symptoms of physical distress   Intensity Rest + 30   Progression Continue progressive overload as per policy without signs/symptoms or physical distress.   Resistance Training   Training Prescription Yes   Weight 2   Reps 10-15   Interval Training   Interval Training No   Treadmill   MPH 3   Grade 0   Minutes 20   NuStep   Level 3  T5 NS   Watts 30   Minutes 15      Goals Met:  Independence with exercise equipment Exercise tolerated well Personal goals reviewed No report of cardiac concerns or symptoms Strength training completed today  Goals Unmet:  Not Applicable  Goals Comments: Reviewed all exercise changes   Dr. Emily Filbert is Medical Director for [place] and  [facility].

## 2015-03-18 ENCOUNTER — Encounter: Payer: Medicare PPO | Admitting: *Deleted

## 2015-03-18 DIAGNOSIS — I252 Old myocardial infarction: Secondary | ICD-10-CM

## 2015-03-18 DIAGNOSIS — Z955 Presence of coronary angioplasty implant and graft: Secondary | ICD-10-CM | POA: Diagnosis not present

## 2015-03-18 NOTE — Progress Notes (Signed)
Daily Session Note  Patient Details  Name: Kristina Macias MRN: [id number] Date of Birth: Apr 24, 1951 Referring Provider:  Isaias Cowman, MD  Encounter Date: 03/18/2015  Check In:     Session Check In - 03/18/15 0902    Check-In   Staff Present Gerlene Burdock RN, BSN;Renee Dillard Essex MS, ACSM CEP Exercise Physiologist;Susanne Bice RN, BSN, [place]   ER physicians immediately available to respond to emergencies See telemetry face sheet for immediately available ER MD   Medication changes reported     No   Fall or balance concerns reported    No   Warm-up and Cool-down Performed on first and last piece of equipment   VAD Patient? No   Pain Assessment   Currently in Pain? No/denies         Goals Met:  Proper associated with RPD/PD & O2 Sat Exercise tolerated well No report of cardiac concerns or symptoms  Goals Unmet:  Not Applicable  Goals Comments:    Dr. Emily Filbert is Medical Director for [place] and [facility].

## 2015-03-21 ENCOUNTER — Encounter: Payer: Medicare PPO | Admitting: *Deleted

## 2015-03-21 DIAGNOSIS — Z955 Presence of coronary angioplasty implant and graft: Secondary | ICD-10-CM

## 2015-03-21 DIAGNOSIS — I252 Old myocardial infarction: Secondary | ICD-10-CM

## 2015-03-21 NOTE — Progress Notes (Signed)
Daily Session Note  Patient Details  Name: Kristina Macias MRN: [id number] Date of Birth: 1950-07-30 Referring Provider:  Isaias Cowman, MD  Encounter Date: 03/21/2015  Check In:     Session Check In - 03/21/15 0819    Check-In   Staff Present Candiss Norse MS, ACSM CEP Exercise Physiologist;Susanne Bice RN, BSN, CCRP;Abdirahim Flavell Alfonso Patten, ACSM CEP Exercise Physiologist   ER physicians immediately available to respond to emergencies See telemetry face sheet for immediately available ER MD   Medication changes reported     No   Fall or balance concerns reported    No   Warm-up and Cool-down Performed on first and last piece of equipment   VAD Patient? No   Pain Assessment   Currently in Pain? No/denies   Multiple Pain Sites No         Goals Met:  Independence with exercise equipment Exercise tolerated well No report of cardiac concerns or symptoms Strength training completed today  Goals Unmet:  Not Applicable  Goals Comments: Patient completed exercise prescription and all exercise goals during rehab session. The exercise was tolerated well and the patient is progressing in the program.     Dr. Emily Filbert is Medical Director for [place] and [facility].

## 2015-03-22 ENCOUNTER — Encounter: Payer: Self-pay | Admitting: *Deleted

## 2015-03-23 ENCOUNTER — Encounter: Payer: Medicare PPO | Attending: Cardiology

## 2015-03-23 DIAGNOSIS — Z955 Presence of coronary angioplasty implant and graft: Secondary | ICD-10-CM | POA: Insufficient documentation

## 2015-03-23 DIAGNOSIS — I252 Old myocardial infarction: Secondary | ICD-10-CM | POA: Diagnosis present

## 2015-03-23 NOTE — Progress Notes (Signed)
Daily Session Note  Patient Details  Name: Kristina Macias MRN: [id number] Date of Birth: 12/28/1950 Referring Provider:  Paraschos, Alexander, MD  Encounter Date: 03/23/2015  Check In:     Session Check In - 03/23/15 0819    Check-In   Staff Present Steven Way BS, ACSM EP-C, Exercise Physiologist;Renee MacMillan MS, ACSM CEP Exercise Physiologist;Diane Wright RN, BSN   ER physicians immediately available to respond to emergencies See telemetry face sheet for immediately available ER MD   Medication changes reported     No   Fall or balance concerns reported    No   Warm-up and Cool-down Performed on first and last piece of equipment   VAD Patient? No   Pain Assessment   Currently in Pain? No/denies         Goals Met:  Proper associated with RPD/PD & O2 Sat Exercise tolerated well No report of cardiac concerns or symptoms Strength training completed today  Goals Unmet:  Not Applicable  Goals Comments:    Dr. Mark Miller is Medical Director for [facility] and [facility]. 

## 2015-03-25 ENCOUNTER — Encounter: Payer: Medicare PPO | Admitting: *Deleted

## 2015-03-25 DIAGNOSIS — I252 Old myocardial infarction: Secondary | ICD-10-CM

## 2015-03-25 DIAGNOSIS — Z955 Presence of coronary angioplasty implant and graft: Secondary | ICD-10-CM | POA: Diagnosis not present

## 2015-03-25 NOTE — Progress Notes (Signed)
Daily Session Note  Patient Details  Name: ZAMORIA BOSS MRN: [id number] Date of Birth: 28-Jun-1950 Referring Provider:  No ref. provider found  Encounter Date: 03/25/2015  Check In:     Session Check In - 03/25/15 0908    Check-In   Staff Present Candiss Norse MS, ACSM CEP Exercise Physiologist;Denzel Etienne RN, BSN;Susanne Bice RN, BSN, [place]   ER physicians immediately available to respond to emergencies See telemetry face sheet for immediately available ER MD   Medication changes reported     No   Fall or balance concerns reported    No   Warm-up and Cool-down Performed on first and last piece of equipment   VAD Patient? No   Pain Assessment   Currently in Pain? No/denies         Goals Met:  Proper associated with RPD/PD & O2 Sat Exercise tolerated well No report of cardiac concerns or symptoms  Goals Unmet:  Not Applicable  Goals Comments: Did well on Treadmill interval training today with incline.   Dr. Emily Filbert is Medical Director for [facility] and [facility].

## 2015-03-28 ENCOUNTER — Encounter: Payer: Medicare PPO | Admitting: *Deleted

## 2015-03-28 ENCOUNTER — Encounter: Payer: Self-pay | Admitting: *Deleted

## 2015-03-28 DIAGNOSIS — Z955 Presence of coronary angioplasty implant and graft: Secondary | ICD-10-CM

## 2015-03-28 DIAGNOSIS — I252 Old myocardial infarction: Secondary | ICD-10-CM

## 2015-03-28 NOTE — Progress Notes (Signed)
Daily Session Note  Patient Details  Name: Kristina Macias MRN: [id number] Date of Birth: 12-28-1950 Referring Provider:  Isaias Cowman, MD  Encounter Date: 03/28/2015  Check In:     Session Check In - 03/28/15 0830    Check-In   Staff Present Candiss Norse MS, ACSM CEP Exercise Physiologist;Susanne Bice RN, BSN, CCRP;Kathern Lobosco Alfonso Patten, ACSM CEP Exercise Physiologist   ER physicians immediately available to respond to emergencies See telemetry face sheet for immediately available ER MD   Medication changes reported     No   Fall or balance concerns reported    No   Warm-up and Cool-down Performed on first and last piece of equipment   VAD Patient? No   Pain Assessment   Currently in Pain? No/denies   Multiple Pain Sites No         Goals Met:  Independence with exercise equipment Exercise tolerated well No report of cardiac concerns or symptoms Strength training completed today  Goals Unmet:  Not Applicable  Goals Comments: Patient completed exercise prescription and all exercise goals during rehab session. The exercise was tolerated well and the patient is progressing in the program.     Dr. Emily Filbert is Medical Director for [facility] and [facility].

## 2015-03-28 NOTE — Progress Notes (Signed)
Cardiac Individual Treatment Plan  Patient Details  Name: Kristina Macias MRN: [id number] Date of Birth: 1950-12-23 Referring Provider:  Marcina Millard, MD  Initial Encounter Date:    Visit Diagnosis: Status post myocardial infarction  S/P coronary artery stent placement  Patient's Home Medications on Admission:  Current outpatient prescriptions:  .  aspirin EC 81 MG EC tablet, Take 1 tablet (81 mg total) by mouth daily., Disp: 30 tablet, Rfl: 2 .  atorvastatin (LIPITOR) 80 MG tablet, Take 1 tablet (80 mg total) by mouth daily at 6 PM., Disp: 30 tablet, Rfl: 2 .  calcium-vitamin D (OSCAL WITH D) 500-200 MG-UNIT per tablet, Take 1 tablet by mouth., Disp: , Rfl:  .  clopidogrel (PLAVIX) 75 MG tablet, Take 1 tablet (75 mg total) by mouth daily., Disp: 30 tablet, Rfl: 2 .  KRILL OIL OMEGA-3 PO, Take 1 capsule by mouth daily., Disp: , Rfl:  .  metoprolol tartrate (LOPRESSOR) 25 MG tablet, Take 1 tablet (25 mg total) by mouth 2 (two) times daily., Disp: 60 tablet, Rfl: 2 .  nitroGLYCERIN (NITROSTAT) 0.4 MG SL tablet, Place 1 tablet (0.4 mg total) under the tongue every 5 (five) minutes as needed for chest pain., Disp: 30 tablet, Rfl: 2  Past Medical History: No past medical history on file.  Tobacco Use: History  Smoking status  . Never Smoker   Smokeless tobacco  . Not on file    Labs: Recent Review Flowsheet Data    Labs for [facility]P Cardiac and Pulmonary Rehab Latest Ref Rng 02/01/2015 02/02/2015   Cholestrol 0 - 200 mg/dL - 305(Y)   LDLCALC 0 - 99 mg/dL - 159(H)   HDL >14 mg/dL - 56   Trlycerides <848 mg/dL - 47   Hemoglobin S3A 4.0 - 6.0 % 5.6 -       Exercise Target Goals:    Exercise Program Goal: Individual exercise prescription set with THRR, safety & activity barriers. Participant demonstrates ability to understand and report RPE using BORG scale, to self-measure pulse accurately, and to acknowledge the importance of the exercise prescription.  Exercise  Prescription Goal: Starting with aerobic activity 30 plus minutes a day, 3 days per week for initial exercise prescription. Provide home exercise prescription and guidelines that participant acknowledges understanding prior to discharge.  Activity Barriers & Risk Stratification:     Activity Barriers & Risk Stratification - 03/01/15 1101    Activity Barriers & Risk Stratification   Activity Barriers None   Risk Stratification High      6 Minute Walk:     6 Minute Walk      03/01/15 1121       6 Minute Walk   Phase Initial     Distance 1900 feet     Walk Time 6 minutes     Resting HR 71 bpm     Resting BP 104/70 mmHg     Max Ex. HR 110 bpm     Max Ex. BP 120/60 mmHg     RPE 11     Symptoms No        Initial Exercise Prescription:     Initial Exercise Prescription - 03/01/15 1100    Date of Initial Exercise Prescription   Date 03/01/15   Treadmill   MPH 3   Grade 0   Minutes 15   Bike   Level 0.4   Minutes 15   Recumbant Bike   Level 5   RPM 45   Watts 40  Minutes 15   NuStep   Level 3   Watts 30   Minutes 15   Arm Ergometer   Level 1   Watts 10   Minutes 10   Arm/Foot Ergometer   Level 4   Watts 12   Minutes 10   Cybex   Level 3   RPM 50   Minutes 15   Recumbant Elliptical   Level 1   RPM 40   Watts 10   Minutes 15   Elliptical   Level 1   Speed 3   Minutes 5   REL-XR   Level 2   Watts 35   Minutes 15   Prescription Details   Frequency (times per week) 3   Duration Progress to 30 minutes of continuous aerobic without signs/symptoms of physical distress   Intensity   THRR REST +  30   Ratings of Perceived Exertion 11-15   Progression Continue progressive overload as per policy without signs/symptoms or physical distress.   Resistance Training   Training Prescription Yes   Weight 2   Reps 10-15      Exercise Prescription Changes:     Exercise Prescription Changes      03/07/15 0800 03/16/15 0800 03/22/15 0600 03/23/15  0900     Exercise Review   Progression  Yes Yes Yes    Response to Exercise   Blood Pressure (Admit)   118/62 mmHg     Blood Pressure (Exercise)   140/68 mmHg     Blood Pressure (Exit)   108/58 mmHg     Heart Rate (Admit)   67 bpm     Heart Rate (Exercise)   94 bpm     Heart Rate (Exit)   70 bpm     Rating of Perceived Exertion (Exercise)   12     Symptoms None None None     Comments First day of exercise! Patient was oriented to the gym and the equipment functions and settings. Procedures and policies of the gym were outlined and explained. The patient's individual exercise prescription and treatment plan were reviewed with them. All starting workloads were established based on the results of the functional testing  done at the initial intake visit. The plan for exercise progression was also introduced and progression will be customized based on the patient's performance and goals.  Reviewed individualized exercise prescription and made increases per departmental policy. Exercise increases were discussed with the patient and they were able to perform the new work loads without issue (no signs or symptoms).  Progressing in exercise through interval training. Discussed this concept and how to complete it on the treadmill. Dicussed how to properly follow interval traiing regimen.  Stated clear understanding.    Duration Progress to 30 minutes of continuous aerobic without signs/symptoms of physical distress Progress to 30 minutes of continuous aerobic without signs/symptoms of physical distress Progress to 30 minutes of continuous aerobic without signs/symptoms of physical distress     Intensity Rest + 30 Rest + 30 Rest + 30     Progression Continue progressive overload as per policy without signs/symptoms or physical distress. Continue progressive overload as per policy without signs/symptoms or physical distress. Continue progressive overload as per policy without signs/symptoms or physical distress.      Resistance Training   Training Prescription Yes Yes Yes     Weight $Remov'2 2 3     'VCjIlR$ Reps 10-15 10-15 10-15     Interval Training   Interval Training No  No Yes     Equipment   Treadmill     Comments   4 min at 27mph/0% followed by 1 min at 13mph/2%; repeat     Treadmill   MPH  3 3     Grade  0 0     Minutes  20 20     NuStep   Level  3  T5 NS 3  T5 NS     Watts  30 30     Minutes  15 15        Discharge Exercise Prescription (Final Exercise Prescription Changes):     Exercise Prescription Changes - 03/23/15 0900    Exercise Review   Progression Yes   Response to Exercise   Comments Dicussed how to properly follow interval traiing regimen.  Stated clear understanding.      Nutrition:  Target Goals: Understanding of nutrition guidelines, daily intake of sodium '1500mg'$ , cholesterol '200mg'$ , calories 30% from fat and 7% or less from saturated fats, daily to have 5 or more servings of fruits and vegetables.  Biometrics:     Pre Biometrics - 03/01/15 1120    Pre Biometrics   Height 5' 8.75" (1.746 m)   Weight 136 lb 6.4 oz (61.871 kg)   Waist Circumference 26.25 inches   Hip Circumference 36.5 inches   Waist to Hip Ratio 0.72 %   BMI (Calculated) 20.3       Nutrition Therapy Plan and Nutrition Goals:     Nutrition Therapy & Goals - 03/21/15 1633    Nutrition Therapy   Diet Instructed on a meal plan based on 1600 calories including DASH diet principles   Drug/Food Interactions Statins/Certain Fruits   Fiber 20 grams   Whole Grain Foods 3 servings   Protein 7 ounces/day   Saturated Fats 11 max. grams   Fruits and Vegetables 5 servings/day   Personal Nutrition Goals   Personal Goal #1 Patient to include at least 6 oz. of protein equivalent per day.   Personal Goal #2 To read labels for saturated and trans fat and sodium.   Personal Goal #3 To try "low sodium" or "very low sodium" tuna      Nutrition Discharge: Rate Your Plate Scores:     Rate Your Plate -  16/38/46 6599    Rate Your Plate Scores   Pre Score 49   Pre Score % 54 %      Nutrition Goals Re-Evaluation:   Psychosocial: Target Goals: Acknowledge presence or absence of depression, maximize coping skills, provide positive support system. Participant is able to verbalize types and ability to use techniques and skills needed for reducing stress and depression.  Initial Review & Psychosocial Screening:     Initial Psych Review & Screening - 03/02/15 [place]? Yes   Screening Interventions   Interventions Encouraged to exercise      Quality of Life Scores:     Quality of Life - 03/01/15 1521    Quality of Life Scores   Health/Function Pre 24.67 %   Socioeconomic Pre 25.17 %   Psych/Spiritual Pre 26.71 %   Family Pre 30 %   GLOBAL Pre 26 %      PHQ-9:     Recent Review Flowsheet Data    Depression screen [facility] 2/9 03/03/2015 03/01/2015   Decreased Interest 0 0   Down, Depressed, Hopeless 0 0   PHQ - 2 Score 0 0   Altered  sleeping 0 0   Tired, decreased energy 1 1   Change in appetite 1 1   Feeling bad or failure about yourself  0 0   Trouble concentrating 0 0   Moving slowly or fidgety/restless 0 0   Suicidal thoughts 0 0   PHQ-9 Score 2 2   Difficult doing work/chores Somewhat difficult Somewhat difficult      Psychosocial Evaluation and Intervention:     Psychosocial Evaluation - 03/07/15 1014    Psychosocial Evaluation & Interventions   Interventions Encouraged to exercise with the program and follow exercise prescription;Relaxation education;Stress management education   Comments Counselor met with Ms. Galla today for initial psychosocial evaluation.  She is a 64 year old who experienced a heart attack and stents inserted approximately one month ago.  Ms. Maslin has a strong support system with a spouse of 33 years and other family and friends who live closeby.  She reports being healthy for the most part and sleeps  well.  Her appetite has improved recently as well once she became educated on what to eat now that she has had the heart attack.  Ms. Sherwood denies a history of anxiety or depression or current symptoms.  She states she is typically in a positive mood.   She will benefit from meeting with the dietician and is on the calendar to do so.  Ms. Hreha would like to be better educated on healthful eating and to increase her strength while in this program.     Continued Psychosocial Services Needed Yes  Ms. Blaker will benefit from the psychoeducational components of this program, especially stress management and relaxation.  She also is scheduled to meet with the dietician to help with her concerns about healthier eating.        Psychosocial Re-Evaluation:   Vocational Rehabilitation: Provide vocational rehab assistance to qualifying candidates.   Vocational Rehab Evaluation & Intervention:     Vocational Rehab - 03/01/15 1101    Initial Vocational Rehab Evaluation & Intervention   Assessment shows need for Vocational Rehabilitation No      Education: Education Goals: Education classes will be provided on a weekly basis, covering required topics. Participant will state understanding/return demonstration of topics presented.  Learning Barriers/Preferences:     Learning Barriers/Preferences - 03/01/15 1101    Learning Barriers/Preferences   Learning Barriers None   Learning Preferences None      Education Topics: General Nutrition Guidelines/Fats and Fiber: -Group instruction provided by verbal, written material, models and posters to present the general guidelines for heart healthy nutrition. Gives an explanation and review of dietary fats and fiber.   Controlling Sodium/Reading Food Labels: -Group verbal and written material supporting the discussion of sodium use in heart healthy nutrition. Review and explanation with models, verbal and written materials for utilization of the food  label.          Cardiac Rehab from 03/28/2015 in [facility] Card[facility]   Date  03/14/15   Educator  CR   Instruction Review Code  2- meets goals/outcomes      Exercise Physiology & Risk Factors: - Group verbal and written instruction with models to review the exercise physiology of the cardiovascular system and associated critical values. Details cardiovascular disease risk factors and the goals associated with each risk factor.      Cardiac Rehab from 03/28/2015 in [facility] Cardiac Rehab   Date  03/23/15   Educator  RM   Instruction Review Code  2- meets goals/outcomes  Aerobic Exercise & Resistance Training: - Gives group verbal and written discussion on the health impact of inactivity. On the components of aerobic and resistive training programs and the benefits of this training and how to safely progress through these programs.      Cardiac Rehab from 03/28/2015 in [facility] Cardiac Rehab   Date  03/28/15   Educator  RM   Instruction Review Code  2- meets goals/outcomes      Flexibility, Balance, General Exercise Guidelines: - Provides group verbal and written instruction on the benefits of flexibility and balance training programs. Provides general exercise guidelines with specific guidelines to those with heart or lung disease. Demonstration and skill practice provided.   Stress Management: - Provides group verbal and written instruction about the health risks of elevated stress, cause of high stress, and healthy ways to reduce stress.   Depression: - Provides group verbal and written instruction on the correlation between heart/lung disease and depressed mood, treatment options, and the stigmas associated with seeking treatment.      Cardiac Rehab from 03/28/2015 in [facility] Cardiac Rehab   Date  03/16/15   Educator  [facility]   Instruction Review Code  2- meets goals/outcomes      Anatomy & Physiology of the Heart: - Group verbal and written instruction and models provide basic cardiac  anatomy and physiology, with the coronary electrical and arterial systems. Review of: AMI, Angina, Valve disease, Heart Failure, Cardiac Arrhythmia, Pacemakers, and the ICD.   Cardiac Procedures: - Group verbal and written instruction and models to describe the testing methods done to diagnose heart disease. Reviews the outcomes of the test results. Describes the treatment choices: Medical Management, Angioplasty, or Coronary Bypass Surgery.      Cardiac Rehab from 03/28/2015 in [facility] Cardiac Rehab   Date  03/07/15   Educator  SB   Instruction Review Code  2- meets goals/outcomes      Cardiac Medications: - Group verbal and written instruction to review commonly prescribed medications for heart disease. Reviews the medication, class of the drug, and side effects. Includes the steps to properly store meds and maintain the prescription regimen.      Cardiac Rehab from 03/28/2015 in [facility] Cardiac Rehab   Date  03/21/15   Educator  SB   Instruction Review Code  2- meets goals/outcomes      Go Sex-Intimacy & Heart Disease, Get SMART - Goal Setting: - Group verbal and written instruction through game format to discuss heart disease and the return to sexual intimacy. Provides group verbal and written material to discuss and apply goal setting through the application of the S.M.A.R.T. Method.      Cardiac Rehab from 03/28/2015 in [facility] Cardiac Rehab   Date  03/07/15   Educator  SB   Instruction Review Code  2- meets goals/outcomes      Other Matters of the Heart: - Provides group verbal, written materials and models to describe Heart Failure, Angina, Valve Disease, and Diabetes in the realm of heart disease. Includes description of the disease process and treatment options available to the cardiac patient.   Exercise & Equipment Safety: - Individual verbal instruction and demonstration of equipment use and safety with use of the equipment.      Cardiac Rehab from 03/28/2015 in [facility] Cardiac Rehab    Date  03/01/15   Educator  C. Enterkin,RN   Instruction Review Code  1- partially meets, needs review/practice      Infection Prevention: - Provides verbal  and written material to individual with discussion of infection control including proper hand washing and proper equipment cleaning during exercise session.      Cardiac Rehab from 03/28/2015 in [facility] Cardiac Rehab   Date  03/01/15   Educator  C. [place]   Instruction Review Code  1- partially meets, needs review/practice      Falls Prevention: - Provides verbal and written material to individual with discussion of falls prevention and safety.      Cardiac Rehab from 03/28/2015 in [facility] Cardiac Rehab   Date  03/01/15   Educator  C. [place]   Instruction Review Code  1- partially meets, needs review/practice      Diabetes: - Individual verbal and written instruction to review signs/symptoms of diabetes, desired ranges of glucose level fasting, after meals and with exercise. Advice that pre and post exercise glucose checks will be done for 3 sessions at entry of program.    Knowledge Questionnaire Score:   Personal Goals and Risk Factors at Admission:     Personal Goals and Risk Factors at Admission - 03/01/15 1039    Personal Goals and Risk Factors on Admission   Increase Aerobic Exercise and Physical Activity Yes   Intervention While in program, learn and follow the exercise prescription taught. Start at a low level workload and increase workload after able to maintain previous level for 30 minutes. Increase time before increasing intensity.   Diabetes No   Hypertension No   Lipids Yes   Goal Cholesterol controlled with medications as prescribed, with individualized exercise RX and with personalized nutrition plan. Value goals: LDL < $Rem'70mg'LIXY$ , HDL > $Rem'40mg'vmEJ$ . Participant states understanding of desired cholesterol values and following prescriptions.   Intervention Provide nutrition & aerobic exercise along with prescribed  medications to achieve LDL '70mg'$ , HDL >$Remo'40mg'LwBlH$ .      Personal Goals and Risk Factors Review:      Goals and Risk Factor Review      03/11/15 0927           Increase Aerobic Exercise and Physical Activity   Goals Progress/Improvement seen  Yes       Comments Cannon states doing well with the exercise and that it helps her in her daily activity. She is exercising Tuesday and Thursday at home 30-6- minutes on a stationary bike. RPE 13.          Abnormal Lipids   Progress seen towards goals Yes       Comments Konstantina is on medication for cholesterol control. She is working on daily nutrition control of saturated fats and sodium. Has an appointment with the RD next week.           Personal Goals Discharge (Final Personal Goals and Risk Factors Review):      Goals and Risk Factor Review - 03/11/15 0927    Increase Aerobic Exercise and Physical Activity   Goals Progress/Improvement seen  Yes   Comments Cleotilde states doing well with the exercise and that it helps her in her daily activity. She is exercising Tuesday and Thursday at home 30-6- minutes on a stationary bike. RPE 13.      Abnormal Lipids   Progress seen towards goals Yes   Comments Kathrynn is on medication for cholesterol control. She is working on daily nutrition control of saturated fats and sodium. Has an appointment with the RD next week.        Comments: 30 day review

## 2015-03-30 ENCOUNTER — Other Ambulatory Visit: Payer: Self-pay | Admitting: *Deleted

## 2015-03-30 DIAGNOSIS — Z955 Presence of coronary angioplasty implant and graft: Secondary | ICD-10-CM

## 2015-03-30 DIAGNOSIS — I252 Old myocardial infarction: Secondary | ICD-10-CM

## 2015-03-30 NOTE — Progress Notes (Signed)
Daily Session Note  Patient Details  Name: ABIOLA BEHRING MRN: [id number] Date of Birth: 1950-12-28 Referring Provider:  Isaias Cowman, MD  Encounter Date: 03/30/2015  Check In:     Session Check In - 03/30/15 0850    Check-In   Staff Present Heath Lark RN, BSN, CCRP;Keyston Ardolino BS, ACSM EP-C, Exercise Physiologist;Renee Dillard [place] MS, ACSM CEP Exercise Physiologist   ER physicians immediately available to respond to emergencies See telemetry face sheet for immediately available ER MD   Medication changes reported     No   Fall or balance concerns reported    No   Warm-up and Cool-down Performed on first and last piece of equipment   VAD Patient? No   Pain Assessment   Currently in Pain? No/denies         Goals Met:  Proper associated with RPD/PD & O2 Sat Exercise tolerated well No report of cardiac concerns or symptoms Strength training completed today  Goals Unmet:  Not Applicable  Goals Comments:   Dr. Emily Filbert is Medical Director for [facility] and [facility].

## 2015-04-01 ENCOUNTER — Encounter: Payer: Medicare PPO | Admitting: *Deleted

## 2015-04-01 DIAGNOSIS — Z955 Presence of coronary angioplasty implant and graft: Secondary | ICD-10-CM

## 2015-04-01 DIAGNOSIS — I252 Old myocardial infarction: Secondary | ICD-10-CM

## 2015-04-01 NOTE — Progress Notes (Signed)
Daily Session Note  Patient Details  Name: Kristina Macias MRN: [id number] Date of Birth: Jul 13, 1950 Referring Provider:  Isaias Cowman, MD  Encounter Date: 04/01/2015  Check In:     Session Check In - 04/01/15 0905    Check-In   Staff Present Heath Lark RN, BSN, CCRP;Carroll Enterkin RN, Drusilla Kanner MS, ACSM CEP Exercise Physiologist   ER physicians immediately available to respond to emergencies See telemetry face sheet for immediately available ER MD   Medication changes reported     No   Fall or balance concerns reported    No   Warm-up and Cool-down Performed on first and last piece of equipment   VAD Patient? No   Pain Assessment   Currently in Pain? No/denies         Goals Met:  Independence with exercise equipment Exercise tolerated well No report of cardiac concerns or symptoms Strength training completed today  Goals Unmet:  Not Applicable  Goals Comments:     Dr. Emily Filbert is Medical Director for [facility] and [facility].

## 2015-04-04 ENCOUNTER — Encounter: Payer: Medicare PPO | Admitting: *Deleted

## 2015-04-04 DIAGNOSIS — Z955 Presence of coronary angioplasty implant and graft: Secondary | ICD-10-CM | POA: Diagnosis not present

## 2015-04-04 DIAGNOSIS — I252 Old myocardial infarction: Secondary | ICD-10-CM

## 2015-04-04 NOTE — Progress Notes (Signed)
Daily Session Note  Patient Details  Name: Kristina Macias MRN: [id number] Date of Birth: 03-29-1951 Referring Provider:  Isaias Cowman, MD  Encounter Date: 04/04/2015  Check In:     Session Check In - 04/04/15 0831    Check-In   Staff Present Candiss Norse MS, ACSM CEP Exercise Physiologist;Susanne Bice RN, BSN, CCRP;Carolan Avedisian Alfonso Patten, ACSM CEP Exercise Physiologist   ER physicians immediately available to respond to emergencies See telemetry face sheet for immediately available ER MD   Medication changes reported     No   Fall or balance concerns reported    No   Warm-up and Cool-down Performed on first and last piece of equipment   VAD Patient? No   Pain Assessment   Currently in Pain? No/denies   Multiple Pain Sites No         Goals Met:  Independence with exercise equipment Exercise tolerated well No report of cardiac concerns or symptoms Strength training completed today  Goals Unmet:  Not Applicable  Goals Comments: Patient completed exercise prescription and all exercise goals during rehab session. The exercise was tolerated well and the patient is progressing in the program.     Dr. Emily Filbert is Medical Director for [facility].

## 2015-04-06 VITALS — Ht 68.75 in | Wt 138.6 lb

## 2015-04-06 DIAGNOSIS — Z955 Presence of coronary angioplasty implant and graft: Secondary | ICD-10-CM

## 2015-04-06 DIAGNOSIS — I252 Old myocardial infarction: Secondary | ICD-10-CM

## 2015-04-06 NOTE — Progress Notes (Signed)
Daily Session Note  Patient Details  Name: ZULEY LUTTER MRN: [id number] Date of Birth: 07-Jul-1950 Referring Provider:  Isaias Cowman, MD  Encounter Date: 04/06/2015  Check In:     Session Check In - 04/06/15 0836    Check-In   Staff Present Lestine Box BS, ACSM EP-C, Exercise Physiologist;Carroll Enterkin RN, BSN;Renee Dillard Essex MS, ACSM CEP Exercise Physiologist   ER physicians immediately available to respond to emergencies See telemetry face sheet for immediately available ER MD   Medication changes reported     No   Fall or balance concerns reported    No   Warm-up and Cool-down Performed on first and last piece of equipment   VAD Patient? No   Pain Assessment   Currently in Pain? No/denies         Goals Met:  Proper associated with RPD/PD & O2 Sat Exercise tolerated well No report of cardiac concerns or symptoms Strength training completed today  Goals Unmet:  Not Applicable  Goals Comments:   Dr. Emily Filbert is Medical Director for [facility].

## 2015-04-06 NOTE — Progress Notes (Signed)
Discharge Summary  Patient Details  Name: Kristina DeisJanice J Guaman MRN: [id number] Date of Birth: 14-Apr-1951 Referring Provider:  Marcina MillardParaschos, Alexander, MD   Number of Visits: 23/36 sessions due to $20 copay each visit.  Reason for Discharge:  Early Exit:  Insurance  Smoking History:  History  Smoking status  . Never Smoker   Smokeless tobacco  . Not on file    Diagnosis:  S/P coronary artery stent placement  Status post myocardial infarction  ADL UCSD:   Initial Exercise Prescription:     Initial Exercise Prescription - 03/01/15 1100    Date of Initial Exercise Prescription   Date 03/01/15   Treadmill   MPH 3   Grade 0   Minutes 15   Bike   Level 0.4   Minutes 15   Recumbant Bike   Level 5   RPM 45   Watts 40   Minutes 15   NuStep   Level 3   Watts 30   Minutes 15   Arm Ergometer   Level 1   Watts 10   Minutes 10   Arm/Foot Ergometer   Level 4   Watts 12   Minutes 10   Cybex   Level 3   RPM 50   Minutes 15   Recumbant Elliptical   Level 1   RPM 40   Watts 10   Minutes 15   Elliptical   Level 1   Speed 3   Minutes 5   REL-XR   Level 2   Watts 35   Minutes 15   Prescription Details   Frequency (times per week) 3   Duration Progress to 30 minutes of continuous aerobic without signs/symptoms of physical distress   Intensity   THRR REST +  30   Ratings of Perceived Exertion 11-15   Progression Continue progressive overload as per policy without signs/symptoms or physical distress.   Resistance Training   Training Prescription Yes   Weight 2   Reps 10-15      Discharge Exercise Prescription (Final Exercise Prescription Changes):     Exercise Prescription Changes - 04/06/15 0800    Exercise Review   Progression Yes   Response to Exercise   Symptoms None   Comments Progressing in exercise through interval training. Discussed this concept and how to complete it on the treadmill.   Duration Progress to 30 minutes of continuous aerobic  without signs/symptoms of physical distress   Intensity Rest + 30   Progression Continue progressive overload as per policy without signs/symptoms or physical distress.   Resistance Training   Training Prescription Yes   Weight 3   Reps 10-15   Interval Training   Interval Training Yes   Equipment Treadmill   Comments 4 min at 473mph/0% followed by 1 min at 503mph/2%; repeat   Treadmill   MPH 3   Grade 0   Minutes 20   NuStep   Level 4  T5 NS   Watts 35   Minutes 15      Functional Capacity:     6 Minute Walk      03/01/15 1121 04/06/15 0948     6 Minute Walk   Phase Initial Discharge    Distance 1900 feet 1800 feet    Walk Time 6 minutes 6 minutes    Resting HR 71 bpm 72 bpm    Resting BP 104/70 mmHg 114/66 mmHg    Max Ex. HR 110 bpm 95 bpm    Max Ex.  BP 120/60 mmHg 130/62 mmHg    RPE 11 12    Symptoms No No       Psychological, QOL, Others - Outcomes: PHQ 2/9: Depression screen [facility] 2/9 03/03/2015 03/01/2015  Decreased Interest 0 0  Down, Depressed, Hopeless 0 0  PHQ - 2 Score 0 0  Altered sleeping 0 0  Tired, decreased energy 1 1  Change in appetite 1 1  Feeling bad or failure about yourself  0 0  Trouble concentrating 0 0  Moving slowly or fidgety/restless 0 0  Suicidal thoughts 0 0  PHQ-9 Score 2 2  Difficult doing work/chores Somewhat difficult Somewhat difficult    Quality of Life:     Quality of Life - 03/01/15 1521    Quality of Life Scores   Health/Function Pre 24.67 %   Socioeconomic Pre 25.17 %   Psych/Spiritual Pre 26.71 %   Family Pre 30 %   GLOBAL Pre 26 %      Personal Goals: Goals established at orientation with interventions provided to work toward goal.     Personal Goals and Risk Factors at Admission - 03/01/15 1039    Personal Goals and Risk Factors on Admission   Increase Aerobic Exercise and Physical Activity Yes   Intervention While in program, learn and follow the exercise prescription taught. Start at a low level  workload and increase workload after able to maintain previous level for 30 minutes. Increase time before increasing intensity.   Diabetes No   Hypertension No   Lipids Yes   Goal Cholesterol controlled with medications as prescribed, with individualized exercise RX and with personalized nutrition plan. Value goals: LDL < , HDL > . Participant states understanding of desired cholesterol values and following prescriptions.   Intervention Provide nutrition & aerobic exercise along with prescribed medications to achieve LDL 70mg , HDL >40mg .       Personal Goals Discharge:     Goals and Risk Factor Review      03/11/15 0927 04/06/15 1042         Increase Aerobic Exercise and Physical Activity   Goals Progress/Improvement seen  Yes Yes      Comments Xandra states doing well with the exercise and that it helps her in her daily activity. She is exercising Tuesday and Thursday at home 30-6- minutes on a stationary bike. RPE 13.    Aviv said she is still using her stationary bike. Delesha has a $20 copay for each Cardiac Rehab visit so she opted for today to be her last day. walk done today. Viviann [address] set her up with a [employer] orientation appt in our other gym.       Abnormal Lipids   Progress seen towards goals Yes Unknown      Comments Denna is on medication for cholesterol control. She is working on daily nutrition control of saturated fats and sodium. Has an appointment with the RD next week.  Will have blood work in the future.          Nutrition & Weight - Outcomes:     Pre Biometrics - 04/06/15 0949    Pre Biometrics   Height 5' 8.75" (1.746 m)   Weight 138 lb 9.6 oz (62.869 kg)   Waist Circumference 29 inches   Hip Circumference 38 inches   Waist to Hip Ratio 0.76 %   BMI (Calculated) 20.7       Nutrition:     Nutrition Therapy & Goals - 03/21/15 1633  Nutrition Therapy   Diet Instructed on a meal plan based on 1600 calories including DASH  diet principles   Drug/Food Interactions Statins/Certain Fruits   Fiber 20 grams   Whole Grain Foods 3 servings   Protein 7 ounces/day   Saturated Fats 11 max. grams   Fruits and Vegetables 5 servings/day   Personal Nutrition Goals   Personal Goal #1 Patient to include at least 6 oz. of protein equivalent per day.   Personal Goal #2 To read labels for saturated and trans fat and sodium.   Personal Goal #3 To try "low sodium" or "very low sodium" tuna      Nutrition Discharge:     Rate Your Plate - 14/78/29 1636    Rate Your Plate Scores   Pre Score 49   Pre Score % 54 %      Education Questionnaire Score:   Goals reviewed with patient; copy given to patient.

## 2015-04-06 NOTE — Patient Instructions (Signed)
Discharge Instructions  Patient Details  Name: Kristina Macias MRN: [id number] Date of Birth: Mar 01, 1951 Referring Provider:  Marcina Millard, MD   Number of Visits: 23/36  Reason for Discharge:  Early Exit:  Insurance $20 copay/visit  Smoking History:  History  Smoking status  . Never Smoker   Smokeless tobacco  . Not on file    Diagnosis:  S/P coronary artery stent placement  Status post myocardial infarction  Initial Exercise Prescription:     Initial Exercise Prescription - 03/01/15 1100    Date of Initial Exercise Prescription   Date 03/01/15   Treadmill   MPH 3   Grade 0   Minutes 15   Bike   Level 0.4   Minutes 15   Recumbant Bike   Level 5   RPM 45   Watts 40   Minutes 15   NuStep   Level 3   Watts 30   Minutes 15   Arm Ergometer   Level 1   Watts 10   Minutes 10   Arm/Foot Ergometer   Level 4   Watts 12   Minutes 10   Cybex   Level 3   RPM 50   Minutes 15   Recumbant Elliptical   Level 1   RPM 40   Watts 10   Minutes 15   Elliptical   Level 1   Speed 3   Minutes 5   REL-XR   Level 2   Watts 35   Minutes 15   Prescription Details   Frequency (times per week) 3   Duration Progress to 30 minutes of continuous aerobic without signs/symptoms of physical distress   Intensity   THRR REST +  30   Ratings of Perceived Exertion 11-15   Progression Continue progressive overload as per policy without signs/symptoms or physical distress.   Resistance Training   Training Prescription Yes   Weight 2   Reps 10-15      Discharge Exercise Prescription (Final Exercise Prescription Changes):     Exercise Prescription Changes - 04/06/15 0800    Exercise Review   Progression Yes   Response to Exercise   Symptoms None   Comments Progressing in exercise through interval training. Discussed this concept and how to complete it on the treadmill.   Duration Progress to 30 minutes of continuous aerobic without signs/symptoms of physical  distress   Intensity Rest + 30   Progression Continue progressive overload as per policy without signs/symptoms or physical distress.   Resistance Training   Training Prescription Yes   Weight 3   Reps 10-15   Interval Training   Interval Training Yes   Equipment Treadmill   Comments 4 min at 78mph/0% followed by 1 min at 24mph/2%; repeat   Treadmill   MPH 3   Grade 0   Minutes 20   NuStep   Level 4  T5 NS   Watts 35   Minutes 15      Functional Capacity:     6 Minute Walk      03/01/15 1121 04/06/15 0948     6 Minute Walk   Phase Initial Discharge    Distance 1900 feet 1800 feet    Walk Time 6 minutes 6 minutes    Resting HR 71 bpm 72 bpm    Resting BP 104/70 mmHg 114/66 mmHg    Max Ex. HR 110 bpm 95 bpm    Max Ex. BP 120/60 mmHg 130/62 mmHg    RPE  11 12    Symptoms No No       Quality of Life:     Quality of Life - 04/06/15 1047    Quality of Life Scores   Health/Function Post 26.53 %   Health/Function % Change 8 %   Socioeconomic Post 25.83 %   Socioeconomic % Change 3 %   Psych/Spiritual Post 27.86 %   Psych/Spiritual % Change 4 %   Family Post 28.3 %   Family % Change -5 %   GLOBAL Post 26.95 %   GLOBAL % Change 4 %      Personal Goals: Goals established at orientation with interventions provided to work toward goal.     Personal Goals and Risk Factors at Admission - 03/01/15 1039    Personal Goals and Risk Factors on Admission   Increase Aerobic Exercise and Physical Activity Yes   Intervention While in program, learn and follow the exercise prescription taught. Start at a low level workload and increase workload after able to maintain previous level for 30 minutes. Increase time before increasing intensity.   Diabetes No   Hypertension No   Lipids Yes   Goal Cholesterol controlled with medications as prescribed, with individualized exercise RX and with personalized nutrition plan. Value goals: LDL < , HDL > . Participant states  understanding of desired cholesterol values and following prescriptions.   Intervention Provide nutrition & aerobic exercise along with prescribed medications to achieve LDL 70mg , HDL >40mg .       Personal Goals Discharge:     Goals and Risk Factor Review - 04/06/15 1042    Increase Aerobic Exercise and Physical Activity   Goals Progress/Improvement seen  Yes   Comments Lundynn said she is still using her stationary bike. Kanyla has a $20 copay for each Cardiac Rehab visit so she opted for today to be her last day. walk done today. Viviann [address]are [address] set her up with a [employer] orientation appt in our other gym.    Abnormal Lipids   Progress seen towards goals Unknown   Comments Will have blood work in the future.       Nutrition & Weight - Outcomes:     Pre Biometrics - 04/06/15 0949    Pre Biometrics   Height 5' 8.75" (1.746 m)   Weight 138 lb 9.6 oz (62.869 kg)   Waist Circumference 29 inches   Hip Circumference 38 inches   Waist to Hip Ratio 0.76 %   BMI (Calculated) 20.7       Nutrition:     Nutrition Therapy & Goals - 03/21/15 1633    Nutrition Therapy   Diet Instructed on a meal plan based on 1600 calories including DASH diet principles   Drug/Food Interactions Statins/Certain Fruits   Fiber 20 grams   Whole Grain Foods 3 servings   Protein 7 ounces/day   Saturated Fats 11 max. grams   Fruits and Vegetables 5 servings/day   Personal Nutrition Goals   Personal Goal #1 Patient to include at least 6 oz. of protein equivalent per day.   Personal Goal #2 To read labels for saturated and trans fat and sodium.   Personal Goal #3 To try "low sodium" or "very low sodium" tuna      Nutrition Discharge:     Rate Your Plate - 16/10/96 1046    Rate Your Plate Scores   Post Score 77   Post Score % 85 %      Education Questionnaire Score:  Knowledge Questionnaire Score - 04/06/15 1047    Knowledge Questionnaire Score   Post Score 24      Goals  reviewed with patient; copy given to patient.

## 2015-04-06 NOTE — Progress Notes (Signed)
Cardiac Individual Treatment Plan  Patient Details  Name: Kristina Macias MRN: [id number] Date of Birth: 1951-05-14 Referring Provider:  Isaias Cowman, MD  Initial Encounter Date:    Visit Diagnosis: S/P coronary artery stent placement  Status post myocardial infarction  Patient's Home Medications on Admission:  Current outpatient prescriptions:  .  aspirin EC 81 MG EC tablet, Take 1 tablet (81 mg total) by mouth daily., Disp: 30 tablet, Rfl: 2 .  atorvastatin (LIPITOR) 80 MG tablet, Take 1 tablet (80 mg total) by mouth daily at 6 PM., Disp: 30 tablet, Rfl: 2 .  calcium-vitamin D (OSCAL WITH D) 500-200 MG-UNIT per tablet, Take 1 tablet by mouth., Disp: , Rfl:  .  clopidogrel (PLAVIX) 75 MG tablet, Take 1 tablet (75 mg total) by mouth daily., Disp: 30 tablet, Rfl: 2 .  KRILL OIL OMEGA-3 PO, Take 1 capsule by mouth daily., Disp: , Rfl:  .  metoprolol tartrate (LOPRESSOR) 25 MG tablet, Take 1 tablet (25 mg total) by mouth 2 (two) times daily., Disp: 60 tablet, Rfl: 2 .  nitroGLYCERIN (NITROSTAT) 0.4 MG SL tablet, Place 1 tablet (0.4 mg total) under the tongue every 5 (five) minutes as needed for chest pain., Disp: 30 tablet, Rfl: 2  Past Medical History: No past medical history on file.  Tobacco Use: History  Smoking status  . Never Smoker   Smokeless tobacco  . Not on file    Labs: Recent Review Flowsheet Data    Labs for ITP Cardiac and Pulmonary Rehab Latest Ref Rng 02/01/2015 02/02/2015   Cholestrol 0 - 200 mg/dL - 237(H)   LDLCALC 0 - 99 mg/dL - 172(H)   HDL >40 mg/dL - 56   Trlycerides <150 mg/dL - 47   Hemoglobin A1c 4.0 - 6.0 % 5.6 -       Exercise Target Goals:    Exercise Program Goal: Individual exercise prescription set with THRR, safety & activity barriers. Participant demonstrates ability to understand and report RPE using BORG scale, to self-measure pulse accurately, and to acknowledge the importance of the exercise prescription.  Exercise  Prescription Goal: Starting with aerobic activity 30 plus minutes a day, 3 days per week for initial exercise prescription. Provide home exercise prescription and guidelines that participant acknowledges understanding prior to discharge.  Activity Barriers & Risk Stratification:     Activity Barriers & Risk Stratification - 03/01/15 1101    Activity Barriers & Risk Stratification   Activity Barriers None   Risk Stratification High      6 Minute Walk:     6 Minute Walk      03/01/15 1121 04/06/15 0948     6 Minute Walk   Phase Initial Discharge    Distance 1900 feet 1800 feet    Walk Time 6 minutes 6 minutes    Resting HR 71 bpm 72 bpm    Resting BP 104/70 mmHg 114/66 mmHg    Max Ex. HR 110 bpm 95 bpm    Max Ex. BP 120/60 mmHg 130/62 mmHg    RPE 11 12    Symptoms No No       Initial Exercise Prescription:     Initial Exercise Prescription - 03/01/15 1100    Date of Initial Exercise Prescription   Date 03/01/15   Treadmill   MPH 3   Grade 0   Minutes 15   Bike   Level 0.4   Minutes 15   Recumbant Bike   Level 5   RPM 45  Watts 40   Minutes 15   NuStep   Level 3   Watts 30   Minutes 15   Arm Ergometer   Level 1   Watts 10   Minutes 10   Arm/Foot Ergometer   Level 4   Watts 12   Minutes 10   Cybex   Level 3   RPM 50   Minutes 15   Recumbant Elliptical   Level 1   RPM 40   Watts 10   Minutes 15   Elliptical   Level 1   Speed 3   Minutes 5   REL-XR   Level 2   Watts 35   Minutes 15   Prescription Details   Frequency (times per week) 3   Duration Progress to 30 minutes of continuous aerobic without signs/symptoms of physical distress   Intensity   THRR REST +  30   Ratings of Perceived Exertion 11-15   Progression Continue progressive overload as per policy without signs/symptoms or physical distress.   Resistance Training   Training Prescription Yes   Weight 2   Reps 10-15      Exercise Prescription Changes:     Exercise  Prescription Changes      03/07/15 0800 03/16/15 0800 03/22/15 0600 03/23/15 0900 04/06/15 0800   Exercise Review   Progression  Yes Yes Yes Yes   Response to Exercise   Blood Pressure (Admit)   118/62 mmHg     Blood Pressure (Exercise)   140/68 mmHg     Blood Pressure (Exit)   108/58 mmHg     Heart Rate (Admit)   67 bpm     Heart Rate (Exercise)   94 bpm     Heart Rate (Exit)   70 bpm     Rating of Perceived Exertion (Exercise)   12     Symptoms None None None  None   Comments First day of exercise! Patient was oriented to the gym and the equipment functions and settings. Procedures and policies of the gym were outlined and explained. The patient's individual exercise prescription and treatment plan were reviewed with them. All starting workloads were established based on the results of the functional testing  done at the initial intake visit. The plan for exercise progression was also introduced and progression will be customized based on the patient's performance and goals.  Reviewed individualized exercise prescription and made increases per departmental policy. Exercise increases were discussed with the patient and they were able to perform the new work loads without issue (no signs or symptoms).  Progressing in exercise through interval training. Discussed this concept and how to complete it on the treadmill. Dicussed how to properly follow interval traiing regimen.  Stated clear understanding. Progressing in exercise through interval training. Discussed this concept and how to complete it on the treadmill.   Duration Progress to 30 minutes of continuous aerobic without signs/symptoms of physical distress Progress to 30 minutes of continuous aerobic without signs/symptoms of physical distress Progress to 30 minutes of continuous aerobic without signs/symptoms of physical distress  Progress to 30 minutes of continuous aerobic without signs/symptoms of physical distress   Intensity Rest + 30 Rest  + 30 Rest + 30  Rest + 30   Progression Continue progressive overload as per policy without signs/symptoms or physical distress. Continue progressive overload as per policy without signs/symptoms or physical distress. Continue progressive overload as per policy without signs/symptoms or physical distress.  Continue progressive overload as per policy  without signs/symptoms or physical distress.   Resistance Training   Training Prescription Yes Yes Yes  Yes   Weight _0 Reps 10-15 10-15 10-15  10-15   Interval Training   Interval Training No No Yes  Yes   Equipment   Treadmill  Treadmill   Comments   4 min at 71mh/0% followed by 1 min at 384m/2%; repeat  4 min at 46m21m0% followed by 1 min at 46mp65m%; repeat   Treadmill   MPH  _1 Grade  0 0  0   Minutes  _2 NuStep   Level  3  T5 NS 3  T5 NS  4  T5 NS   Watts  30 30  35   Minutes  _3 Discharge Exercise Prescription (Final Exercise Prescription Changes):     Exercise Prescription Changes - 04/06/15 0800    Exercise Review   Progression Yes   Response to Exercise   Symptoms None   Comments Progressing in exercise through interval training. Discussed this concept and how to complete it on the treadmill.   Duration Progress to 30 minutes of continuous aerobic without signs/symptoms of physical distress   Intensity Rest + 30   Progression Continue progressive overload as per policy without signs/symptoms or physical distress.   Resistance Training   Training Prescription Yes   Weight 3   Reps 10-15   Interval Training   Interval Training Yes   Equipment Treadmill   Comments 4 min at 46mph59m followed by 1 min at 46mph/32m repeat   Treadmill   MPH 3   Grade 0   Minutes 20   NuStep   Level 4  T5 NS   Watts 35   Minutes 15      Nutrition:  Target Goals: Understanding of nutrition guidelines, daily intake of sodium <1500mg, 546mesterol <200mg, c25mies 30% from fat and 7% or less from  saturated fats, daily to have 5 or more servings of fruits and vegetables.  Biometrics:     Pre Biometrics - 04/06/15 0949    Pre Biometrics   Height 5' 8.75" (1.746 m)   Weight 138 lb 9.6 oz (62.869 kg)   Waist Circumference 29 inches   Hip Circumference 38 inches   Waist to Hip Ratio 0.76 %   BMI (Calculated) 20.7       Nutrition Therapy Plan and Nutrition Goals:     Nutrition Therapy & Goals - 03/21/15 1633    Nutrition Therapy   Diet Instructed on a meal plan based on 1600 calories including DASH diet principles   Drug/Food Interactions Statins/Certain Fruits   Fiber 20 grams   Whole Grain Foods 3 servings   Protein 7 ounces/day   Saturated Fats 11 max. grams   Fruits and Vegetables 5 servings/day   Personal Nutrition Goals   Personal Goal #1 Patient to include at least 6 oz. of protein equivalent per day.   Personal Goal #2 To read labels for saturated and trans fat and sodium.   Personal Goal #3 To try "low sodium" or "very low sodium" tuna      Nutrition Discharge: Rate Your Plate Scores:     Rate Your Plate - 03/24/1656/01/77 9390 Your Plate Scores   Pre Score 49   Pre Score % 54 %      Nutrition Goals Re-Evaluation:  Nutrition Goals Re-Evaluation      04/06/15 1041           Personal Goal #1 Re-Evaluation   Personal Goal #1 Emunah reports that she doesn't eat when she is stressed and has to remember to try to get more protein in.        Goal Progress Seen Yes       Personal Goal #3 Re-Evaluation   Personal Goal #3 Shaelynn has been trying low sodium products          Psychosocial: Target Goals: Acknowledge presence or absence of depression, maximize coping skills, provide positive support system. Participant is able to verbalize types and ability to use techniques and skills needed for reducing stress and depression.  Initial Review & Psychosocial Screening:     Initial Psych Review & Screening - 03/02/15 [place]? Yes   Screening Interventions   Interventions Encouraged to exercise      Quality of Life Scores:     Quality of Life - 03/01/15 1521    Quality of Life Scores   Health/Function Pre 24.67 %   Socioeconomic Pre 25.17 %   Psych/Spiritual Pre 26.71 %   Family Pre 30 %   GLOBAL Pre 26 %      PHQ-9:     Recent Review Flowsheet Data    Depression screen [facility] 2/9 03/03/2015 03/01/2015   Decreased Interest 0 0   Down, Depressed, Hopeless 0 0   PHQ - 2 Score 0 0   Altered sleeping 0 0   Tired, decreased energy 1 1   Change in appetite 1 1   Feeling bad or failure about yourself  0 0   Trouble concentrating 0 0   Moving slowly or fidgety/restless 0 0   Suicidal thoughts 0 0   PHQ-9 Score 2 2   Difficult doing work/chores Somewhat difficult Somewhat difficult      Psychosocial Evaluation and Intervention:     Psychosocial Evaluation - 03/07/15 1014    Psychosocial Evaluation & Interventions   Interventions Encouraged to exercise with the program and follow exercise prescription;Relaxation education;Stress management education   Comments Counselor met with Ms. Paladino today for initial psychosocial evaluation.  She is a 64 year old who experienced a heart attack and stents inserted approximately one month ago.  Ms. Macnair has a strong support system with a spouse of 30 years and other family and friends who live closeby.  She reports being healthy for the most part and sleeps well.  Her appetite has improved recently as well once she became educated on what to eat now that she has had the heart attack.  Ms. Schiro denies a history of anxiety or depression or current symptoms.  She states she is typically in a positive mood.   She will benefit from meeting with the dietician and is on the calendar to do so.  Ms. Tatem would like to be better educated on healthful eating and to increase her strength while in this program.     Continued Psychosocial Services Needed Yes  Ms.  Pedregon will benefit from the psychoeducational components of this program, especially stress management and relaxation.  She also is scheduled to meet with the dietician to help with her concerns about healthier eating.        Psychosocial Re-Evaluation:     Psychosocial Re-Evaluation      04/04/15 0938 04/06/15 1044  Psychosocial Re-Evaluation   Interventions  Encouraged to attend Cardiac Rehabilitation for the exercise      Comments Counselor follow up with Ms. Haggar reporting feeling stronger and better overall since coming into this program.  She plans to transfer to the [employer] program at [facility] since she is stable and there is no co-pay there to exercise.  Counselor commended Ms. Guinther on her hard work and progress made.            Vocational Rehabilitation: Provide vocational rehab assistance to qualifying candidates.   Vocational Rehab Evaluation & Intervention:     Vocational Rehab - 03/01/15 1101    Initial Vocational Rehab Evaluation & Intervention   Assessment shows need for Vocational Rehabilitation No      Education: Education Goals: Education classes will be provided on a weekly basis, covering required topics. Participant will state understanding/return demonstration of topics presented.  Learning Barriers/Preferences:     Learning Barriers/Preferences - 03/01/15 1101    Learning Barriers/Preferences   Learning Barriers None   Learning Preferences None      Education Topics: General Nutrition Guidelines/Fats and Fiber: -Group instruction provided by verbal, written material, models and posters to present the general guidelines for heart healthy nutrition. Gives an explanation and review of dietary fats and fiber.   Controlling Sodium/Reading Food Labels: -Group verbal and written material supporting the discussion of sodium use in heart healthy nutrition. Review and explanation with models, verbal and written materials for utilization of  the food label.          Cardiac Rehab from 04/06/2015 in [facility] Cardiac Rehab   Date  03/14/15   Educator  CR   Instruction Review Code  2- meets goals/outcomes      Exercise Physiology & Risk Factors: - Group verbal and written instruction with models to review the exercise physiology of the cardiovascular system and associated critical values. Details cardiovascular disease risk factors and the goals associated with each risk factor.      Cardiac Rehab from 04/06/2015 in [facility] Cardiac Rehab   Date  03/23/15   Educator  RM   Instruction Review Code  2- meets goals/outcomes      Aerobic Exercise & Resistance Training: - Gives group verbal and written discussion on the health impact of inactivity. On the components of aerobic and resistive training programs and the benefits of this training and how to safely progress through these programs.      Cardiac Rehab from 04/06/2015 in [facility] Cardiac Rehab   Date  03/28/15   Educator  RM   Instruction Review Code  2- meets goals/outcomes      Flexibility, Balance, General Exercise Guidelines: - Provides group verbal and written instruction on the benefits of flexibility and balance training programs. Provides general exercise guidelines with specific guidelines to those with heart or lung disease. Demonstration and skill practice provided.      Cardiac Rehab from 04/06/2015 in [facility] Cardiac Rehab   Date  04/04/15   Educator  RM   Instruction Review Code  2- meets goals/outcomes      Stress Management: - Provides group verbal and written instruction about the health risks of elevated stress, cause of high stress, and healthy ways to reduce stress.      Cardiac Rehab from 04/06/2015 in [facility] Cardiac Rehab   Date  04/06/15   Educator  [facility]   Instruction Review Code  2- meets goals/outcomes      Depression: - Provides group verbal  and written instruction on the correlation between heart/lung disease and depressed mood, treatment options,  and the stigmas associated with seeking treatment.      Cardiac Rehab from 04/06/2015 in [facility] Cardiac Rehab   Date  03/16/15   Educator  [facility]   Instruction Review Code  2- meets goals/outcomes      Anatomy & Physiology of the Heart: - Group verbal and written instruction and models provide basic cardiac anatomy and physiology, with the coronary electrical and arterial systems. Review of: AMI, Angina, Valve disease, Heart Failure, Cardiac Arrhythmia, Pacemakers, and the ICD.   Cardiac Procedures: - Group verbal and written instruction and models to describe the testing methods done to diagnose heart disease. Reviews the outcomes of the test results. Describes the treatment choices: Medical Management, Angioplasty, or Coronary Bypass Surgery.      Cardiac Rehab from 04/06/2015 in [facility] Cardiac Rehab   Date  03/07/15   Educator  SB   Instruction Review Code  2- meets goals/outcomes      Cardiac Medications: - Group verbal and written instruction to review commonly prescribed medications for heart disease. Reviews the medication, class of the drug, and side effects. Includes the steps to properly store meds and maintain the prescription regimen.      Cardiac Rehab from 04/06/2015 in [facility] Cardiac Rehab   Date  03/21/15   Educator  SB   Instruction Review Code  2- meets goals/outcomes      Go Sex-Intimacy & Heart Disease, Get SMART - Goal Setting: - Group verbal and written instruction through game format to discuss heart disease and the return to sexual intimacy. Provides group verbal and written material to discuss and apply goal setting through the application of the S.M.A.R.T. Method.      Cardiac Rehab from 04/06/2015 in [facility] Cardiac Rehab   Date  03/07/15   Educator  SB   Instruction Review Code  2- meets goals/outcomes      Other Matters of the Heart: - Provides group verbal, written materials and models to describe Heart Failure, Angina, Valve Disease, and Diabetes in the realm  of heart disease. Includes description of the disease process and treatment options available to the cardiac patient.   Exercise & Equipment Safety: - Individual verbal instruction and demonstration of equipment use and safety with use of the equipment.      Cardiac Rehab from 04/06/2015 in [facility] Cardiac Rehab   Date  03/01/15   Educator  C. [place],RN   Instruction Review Code  1- partially meets, needs review/practice      Infection Prevention: - Provides verbal and written material to individual with discussion of infection control including proper hand washing and proper equipment cleaning during exercise session.      Cardiac Rehab from 04/06/2015 in [facility] Cardiac Rehab   Date  03/01/15   Educator  C. [place]   Instruction Review Code  1- partially meets, needs review/practice      Falls Prevention: - Provides verbal and written material to individual with discussion of falls prevention and safety.      Cardiac Rehab from 04/06/2015 in [facility] Cardiac Rehab   Date  03/01/15   Educator  C. [place]   Instruction Review Code  1- partially meets, needs review/practice      Diabetes: - Individual verbal and written instruction to review signs/symptoms of diabetes, desired ranges of glucose level fasting, after meals and with exercise. Advice that pre and post exercise glucose checks will be done for  3 sessions at entry of program.    Knowledge Questionnaire Score:   Personal Goals and Risk Factors at Admission:     Personal Goals and Risk Factors at Admission - 03/01/15 1039    Personal Goals and Risk Factors on Admission   Increase Aerobic Exercise and Physical Activity Yes   Intervention While in program, learn and follow the exercise prescription taught. Start at a low level workload and increase workload after able to maintain previous level for 30 minutes. Increase time before increasing intensity.   Diabetes No   Hypertension No   Lipids Yes   Goal Cholesterol  controlled with medications as prescribed, with individualized exercise RX and with personalized nutrition plan. Value goals: LDL < 70m, HDL > 445m Participant states understanding of desired cholesterol values and following prescriptions.   Intervention Provide nutrition & aerobic exercise along with prescribed medications to achieve LDL <7039mHDL >72m11m    Personal Goals and Risk Factors Review:      Goals and Risk Factor Review      03/11/15 0927 04/06/15 1042         Increase Aerobic Exercise and Physical Activity   Goals Progress/Improvement seen  Yes Yes      Comments JaniKarrites doing well with the exercise and that it helps her in her daily activity. She is exercising Tuesday and Thursday at home 30-6- minutes on a stationary bike. RPE 13.    JaniTyniad she is still using her stationary bike. JaniArwa a $20 copay for each Cardiac Rehab visit so she opted for today to be her last day. 6min33m walk done today. SteveRemo Lippsset her up with a [employer]ne[employer] orientation appt in our other gym.       Abnormal Lipids   Progress seen towards goals Yes Unknown      Comments JanicMahailan medication for cholesterol control. She is working on daily nutrition control of saturated fats and sodium. Has an appointment with the RD next week.  Will have blood work in the future.          Personal Goals Discharge (Final Personal Goals and Risk Factors Review):      Goals and Risk Factor Review - 04/06/15 1042    Increase Aerobic Exercise and Physical Activity   Goals Progress/Improvement seen  Yes   Comments JanicRoselani she is still using her stationary bike. JanicRumia $20 copay for each Cardiac Rehab visit so she opted for today to be her last day. 6minu39mwalk done today. StevenRemo Lippset her up with a Silver [employer]ne[employer] orientation appt in our other gym.    Abnormal Lipids   Progress seen towards goals Unknown   Comments Will have blood work in the future.        Comments: Graduated  early from Cardiac Rehab early since $20copay/visit.

## 2015-04-06 NOTE — Progress Notes (Signed)
Cardiac Individual Treatment Plan  Patient Details  Name: Kristina Macias MRN: [id number] Date of Birth: 01/08/1951 Referring Provider:  Marcina Millard, MD  Initial Encounter Date:  03/01/2015  Visit Diagnosis: S/P coronary artery stent placement - Plan: CARDIAC REHAB 30 DAY REVIEW  Status post myocardial infarction  Patient's Home Medications on Admission:  Current outpatient prescriptions:  .  aspirin EC 81 MG EC tablet, Take 1 tablet (81 mg total) by mouth daily., Disp: 30 tablet, Rfl: 2 .  atorvastatin (LIPITOR) 80 MG tablet, Take 1 tablet (80 mg total) by mouth daily at 6 PM., Disp: 30 tablet, Rfl: 2 .  calcium-vitamin D (OSCAL WITH D) 500-200 MG-UNIT per tablet, Take 1 tablet by mouth., Disp: , Rfl:  .  clopidogrel (PLAVIX) 75 MG tablet, Take 1 tablet (75 mg total) by mouth daily., Disp: 30 tablet, Rfl: 2 .  KRILL OIL OMEGA-3 PO, Take 1 capsule by mouth daily., Disp: , Rfl:  .  metoprolol tartrate (LOPRESSOR) 25 MG tablet, Take 1 tablet (25 mg total) by mouth 2 (two) times daily., Disp: 60 tablet, Rfl: 2 .  nitroGLYCERIN (NITROSTAT) 0.4 MG SL tablet, Place 1 tablet (0.4 mg total) under the tongue every 5 (five) minutes as needed for chest pain., Disp: 30 tablet, Rfl: 2  Past Medical History: No past medical history on file.  Tobacco Use: History  Smoking status  . Never Smoker   Smokeless tobacco  . Not on file    Labs: Recent Review Flowsheet Data    Labs for ITP Cardiac and Pulmonary Rehab Latest Ref Rng 02/01/2015 02/02/2015   Cholestrol 0 - 200 mg/dL - 499(Z)   LDLCALC 0 - 99 mg/dL - 352(S)   HDL >39 mg/dL - 56   Trlycerides <147 mg/dL - 47   Hemoglobin D8E 4.0 - 6.0 % 5.6 -       Exercise Target Goals:    Exercise Program Goal: Individual exercise prescription set with THRR, safety & activity barriers. Participant demonstrates ability to understand and report RPE using BORG scale, to self-measure pulse accurately, and to acknowledge the importance of  the exercise prescription.  Exercise Prescription Goal: Starting with aerobic activity 30 plus minutes a day, 3 days per week for initial exercise prescription. Provide home exercise prescription and guidelines that participant acknowledges understanding prior to discharge.  Activity Barriers & Risk Stratification:     Activity Barriers & Risk Stratification - 03/01/15 1101    Activity Barriers & Risk Stratification   Activity Barriers None   Risk Stratification High      6 Minute Walk:     6 Minute Walk      03/01/15 1121 04/06/15 0948     6 Minute Walk   Phase Initial Discharge    Distance 1900 feet 1800 feet    Walk Time 6 minutes 6 minutes    Resting HR 71 bpm 72 bpm    Resting BP 104/70 mmHg 114/66 mmHg    Max Ex. HR 110 bpm 95 bpm    Max Ex. BP 120/60 mmHg 130/62 mmHg    RPE 11 12    Symptoms No No       Initial Exercise Prescription:     Initial Exercise Prescription - 03/01/15 1100    Date of Initial Exercise Prescription   Date 03/01/15   Treadmill   MPH 3   Grade 0   Minutes 15   Bike   Level 0.4   Minutes 15   Recumbant Bike  Level 5   RPM 45   Watts 40   Minutes 15   NuStep   Level 3   Watts 30   Minutes 15   Arm Ergometer   Level 1   Watts 10   Minutes 10   Arm/Foot Ergometer   Level 4   Watts 12   Minutes 10   Cybex   Level 3   RPM 50   Minutes 15   Recumbant Elliptical   Level 1   RPM 40   Watts 10   Minutes 15   Elliptical   Level 1   Speed 3   Minutes 5   REL-XR   Level 2   Watts 35   Minutes 15   Prescription Details   Frequency (times per week) 3   Duration Progress to 30 minutes of continuous aerobic without signs/symptoms of physical distress   Intensity   THRR REST +  30   Ratings of Perceived Exertion 11-15   Progression Continue progressive overload as per policy without signs/symptoms or physical distress.   Resistance Training   Training Prescription Yes   Weight 2   Reps 10-15      Exercise  Prescription Changes:     Exercise Prescription Changes      03/07/15 0800 03/16/15 0800 03/22/15 0600 03/23/15 0900 04/06/15 0800   Exercise Review   Progression  Yes Yes Yes Yes   Response to Exercise   Blood Pressure (Admit)   118/62 mmHg     Blood Pressure (Exercise)   140/68 mmHg     Blood Pressure (Exit)   108/58 mmHg     Heart Rate (Admit)   67 bpm     Heart Rate (Exercise)   94 bpm     Heart Rate (Exit)   70 bpm     Rating of Perceived Exertion (Exercise)   12     Symptoms None None None  None   Comments First day of exercise! Patient was oriented to the gym and the equipment functions and settings. Procedures and policies of the gym were outlined and explained. The patient's individual exercise prescription and treatment plan were reviewed with them. All starting workloads were established based on the results of the functional testing  done at the initial intake visit. The plan for exercise progression was also introduced and progression will be customized based on the patient's performance and goals.  Reviewed individualized exercise prescription and made increases per departmental policy. Exercise increases were discussed with the patient and they were able to perform the new work loads without issue (no signs or symptoms).  Progressing in exercise through interval training. Discussed this concept and how to complete it on the treadmill. Dicussed how to properly follow interval traiing regimen.  Stated clear understanding. Progressing in exercise through interval training. Discussed this concept and how to complete it on the treadmill.   Duration Progress to 30 minutes of continuous aerobic without signs/symptoms of physical distress Progress to 30 minutes of continuous aerobic without signs/symptoms of physical distress Progress to 30 minutes of continuous aerobic without signs/symptoms of physical distress  Progress to 30 minutes of continuous aerobic without signs/symptoms of physical  distress   Intensity Rest + 30 Rest + 30 Rest + 30  Rest + 30   Progression Continue progressive overload as per policy without signs/symptoms or physical distress. Continue progressive overload as per policy without signs/symptoms or physical distress. Continue progressive overload as per policy without signs/symptoms or physical  distress.  Continue progressive overload as per policy without signs/symptoms or physical distress.   Resistance Training   Training Prescription Yes Yes Yes  Yes   Weight $Remov'2 2 3  3   'CtOKgM$ Reps 10-15 10-15 10-15  10-15   Interval Training   Interval Training No No Yes  Yes   Equipment   Treadmill  Treadmill   Comments   4 min at 62mph/0% followed by 1 min at 16mph/2%; repeat  4 min at 22mph/0% followed by 1 min at 8mph/2%; repeat   Treadmill   MPH  $Re'3 3  3   'pNE$ Grade  0 0  0   Minutes  $Remove'20 20  20   'PsrUnAY$ NuStep   Level  3  T5 NS 3  T5 NS  4  T5 NS   Watts  30 30  35   Minutes  $Remove'15 15  15      'VxlWEDr$ Discharge Exercise Prescription (Final Exercise Prescription Changes):     Exercise Prescription Changes - 04/06/15 0800    Exercise Review   Progression Yes   Response to Exercise   Symptoms None   Comments Progressing in exercise through interval training. Discussed this concept and how to complete it on the treadmill.   Duration Progress to 30 minutes of continuous aerobic without signs/symptoms of physical distress   Intensity Rest + 30   Progression Continue progressive overload as per policy without signs/symptoms or physical distress.   Resistance Training   Training Prescription Yes   Weight 3   Reps 10-15   Interval Training   Interval Training Yes   Equipment Treadmill   Comments 4 min at 27mph/0% followed by 1 min at 78mph/2%; repeat   Treadmill   MPH 3   Grade 0   Minutes 20   NuStep   Level 4  T5 NS   Watts 35   Minutes 15      Nutrition:  Target Goals: Understanding of nutrition guidelines, daily intake of sodium '1500mg'$ , cholesterol '200mg'$ , calories  30% from fat and 7% or less from saturated fats, daily to have 5 or more servings of fruits and vegetables.  Biometrics:     Pre Biometrics - 04/06/15 0949    Pre Biometrics   Height 5' 8.75" (1.746 m)   Weight 138 lb 9.6 oz (62.869 kg)   Waist Circumference 29 inches   Hip Circumference 38 inches   Waist to Hip Ratio 0.76 %   BMI (Calculated) 20.7       Nutrition Therapy Plan and Nutrition Goals:     Nutrition Therapy & Goals - 03/21/15 1633    Nutrition Therapy   Diet Instructed on a meal plan based on 1600 calories including DASH diet principles   Drug/Food Interactions Statins/Certain Fruits   Fiber 20 grams   Whole Grain Foods 3 servings   Protein 7 ounces/day   Saturated Fats 11 max. grams   Fruits and Vegetables 5 servings/day   Personal Nutrition Goals   Personal Goal #1 Patient to include at least 6 oz. of protein equivalent per day.   Personal Goal #2 To read labels for saturated and trans fat and sodium.   Personal Goal #3 To try "low sodium" or "very low sodium" tuna      Nutrition Discharge: Rate Your Plate Scores:     Rate Your Plate - 83/25/49 8264    Rate Your Plate Scores   Post Score 77   Post Score % 85 %  Nutrition Goals Re-Evaluation:     Nutrition Goals Re-Evaluation      04/06/15 1041           Personal Goal #1 Re-Evaluation   Personal Goal #1 Shaneque reports that she doesn't eat when she is stressed and has to remember to try to get more protein in.        Goal Progress Seen Yes       Personal Goal #3 Re-Evaluation   Personal Goal #3 Denielle has been trying low sodium products          Psychosocial: Target Goals: Acknowledge presence or absence of depression, maximize coping skills, provide positive support system. Participant is able to verbalize types and ability to use techniques and skills needed for reducing stress and depression.  Initial Review & Psychosocial Screening:     Initial Psych Review & Screening -  03/02/15 [place]? Yes   Screening Interventions   Interventions Encouraged to exercise      Quality of Life Scores:     Quality of Life - 04/06/15 1047    Quality of Life Scores   Health/Function Post 26.53 %   Health/Function % Change 8 %   Socioeconomic Post 25.83 %   Socioeconomic % Change 3 %   Psych/Spiritual Post 27.86 %   Psych/Spiritual % Change 4 %   Family Post 28.3 %   Family % Change -5 %   GLOBAL Post 26.95 %   GLOBAL % Change 4 %      PHQ-9:     Recent Review Flowsheet Data    Depression screen [facility] 2/9 04/06/2015 03/03/2015 03/01/2015   Decreased Interest 0 0 0   Down, Depressed, Hopeless 0 0 0   PHQ - 2 Score 0 0 0   Altered sleeping 0 0 0   Tired, decreased energy 0 1 1   Change in appetite 0 1 1   Feeling bad or failure about yourself  0 0 0   Trouble concentrating 0 0 0   Moving slowly or fidgety/restless 0 0 0   Suicidal thoughts 0 0 0   PHQ-9 Score 0 2 2   Difficult doing work/chores Not difficult at all Somewhat difficult Somewhat difficult      Psychosocial Evaluation and Intervention:     Psychosocial Evaluation - 03/07/15 1014    Psychosocial Evaluation & Interventions   Interventions Encouraged to exercise with the program and follow exercise prescription;Relaxation education;Stress management education   Comments Counselor met with Ms. Setzer today for initial psychosocial evaluation.  She is a 64 year old who experienced a heart attack and stents inserted approximately one month ago.  Ms. Whitmyer has a strong support system with a spouse of 68 years and other family and friends who live closeby.  She reports being healthy for the most part and sleeps well.  Her appetite has improved recently as well once she became educated on what to eat now that she has had the heart attack.  Ms. Mellor denies a history of anxiety or depression or current symptoms.  She states she is typically in a positive mood.   She  will benefit from meeting with the dietician and is on the calendar to do so.  Ms. Devins would like to be better educated on healthful eating and to increase her strength while in this program.     Continued Psychosocial Services Needed Yes  Ms. Copelin will benefit from  the psychoeducational components of this program, especially stress management and relaxation.  She also is scheduled to meet with the dietician to help with her concerns about healthier eating.        Psychosocial Re-Evaluation:     Psychosocial Re-Evaluation      04/04/15 0938 04/06/15 1044         Psychosocial Re-Evaluation   Interventions  Encouraged to attend Cardiac Rehabilitation for the exercise      Comments Counselor follow up with Ms. Schwalb reporting feeling stronger and better overall since coming into this program.  She plans to transfer to the [employer] program at [facility] since she is stable and there is no co-pay there to exercise.  Counselor commended Ms. Costantino on her hard work and progress made.            Vocational Rehabilitation: Provide vocational rehab assistance to qualifying candidates.   Vocational Rehab Evaluation & Intervention:     Vocational Rehab - 03/01/15 1101    Initial Vocational Rehab Evaluation & Intervention   Assessment shows need for Vocational Rehabilitation No      Education: Education Goals: Education classes will be provided on a weekly basis, covering required topics. Participant will state understanding/return demonstration of topics presented.  Learning Barriers/Preferences:     Learning Barriers/Preferences - 03/01/15 1101    Learning Barriers/Preferences   Learning Barriers None   Learning Preferences None      Education Topics: General Nutrition Guidelines/Fats and Fiber: -Group instruction provided by verbal, written material, models and posters to present the general guidelines for heart healthy nutrition. Gives an explanation and review of dietary  fats and fiber.   Controlling Sodium/Reading Food Labels: -Group verbal and written material supporting the discussion of sodium use in heart healthy nutrition. Review and explanation with models, verbal and written materials for utilization of the food label.          Cardiac Rehab from 04/06/2015 in [facility] Cardiac Rehab   Date  03/14/15   Educator  CR   Instruction Review Code  2- meets goals/outcomes      Exercise Physiology & Risk Factors: - Group verbal and written instruction with models to review the exercise physiology of the cardiovascular system and associated critical values. Details cardiovascular disease risk factors and the goals associated with each risk factor.      Cardiac Rehab from 04/06/2015 in [facility] Cardiac Rehab   Date  03/23/15   Educator  RM   Instruction Review Code  2- meets goals/outcomes      Aerobic Exercise & Resistance Training: - Gives group verbal and written discussion on the health impact of inactivity. On the components of aerobic and resistive training programs and the benefits of this training and how to safely progress through these programs.      Cardiac Rehab from 04/06/2015 in [facility] Cardiac Rehab   Date  03/28/15   Educator  RM   Instruction Review Code  2- meets goals/outcomes      Flexibility, Balance, General Exercise Guidelines: - Provides group verbal and written instruction on the benefits of flexibility and balance training programs. Provides general exercise guidelines with specific guidelines to those with heart or lung disease. Demonstration and skill practice provided.      Cardiac Rehab from 04/06/2015 in [facility] Cardiac Rehab   Date  04/04/15   Educator  RM   Instruction Review Code  2- meets goals/outcomes      Stress Management: - Provides group verbal and  written instruction about the health risks of elevated stress, cause of high stress, and healthy ways to reduce stress.      Cardiac Rehab from 04/06/2015 in [facility] Cardiac  Rehab   Date  04/06/15   Educator  [facility]   Instruction Review Code  2- meets goals/outcomes      Depression: - Provides group verbal and written instruction on the correlation between heart/lung disease and depressed mood, treatment options, and the stigmas associated with seeking treatment.      Cardiac Rehab from 04/06/2015 in [facility] Cardiac Rehab   Date  03/16/15   Educator  [facility]   Instruction Review Code  2- meets goals/outcomes      Anatomy & Physiology of the Heart: - Group verbal and written instruction and models provide basic cardiac anatomy and physiology, with the coronary electrical and arterial systems. Review of: AMI, Angina, Valve disease, Heart Failure, Cardiac Arrhythmia, Pacemakers, and the ICD.   Cardiac Procedures: - Group verbal and written instruction and models to describe the testing methods done to diagnose heart disease. Reviews the outcomes of the test results. Describes the treatment choices: Medical Management, Angioplasty, or Coronary Bypass Surgery.      Cardiac Rehab from 04/06/2015 in [facility] Cardiac Rehab   Date  03/07/15   Educator  SB   Instruction Review Code  2- meets goals/outcomes      Cardiac Medications: - Group verbal and written instruction to review commonly prescribed medications for heart disease. Reviews the medication, class of the drug, and side effects. Includes the steps to properly store meds and maintain the prescription regimen.      Cardiac Rehab from 04/06/2015 in [facility] Cardiac Rehab   Date  03/21/15   Educator  SB   Instruction Review Code  2- meets goals/outcomes      Go Sex-Intimacy & Heart Disease, Get SMART - Goal Setting: - Group verbal and written instruction through game format to discuss heart disease and the return to sexual intimacy. Provides group verbal and written material to discuss and apply goal setting through the application of the S.M.A.R.T. Method.      Cardiac Rehab from 04/06/2015 in [facility] Cardiac Rehab    Date  03/07/15   Educator  SB   Instruction Review Code  2- meets goals/outcomes      Other Matters of the Heart: - Provides group verbal, written materials and models to describe Heart Failure, Angina, Valve Disease, and Diabetes in the realm of heart disease. Includes description of the disease process and treatment options available to the cardiac patient.   Exercise & Equipment Safety: - Individual verbal instruction and demonstration of equipment use and safety with use of the equipment.      Cardiac Rehab from 04/06/2015 in [facility] Cardiac Rehab   Date  03/01/15   Educator  C. [place],RN   Instruction Review Code  1- partially meets, needs review/practice      Infection Prevention: - Provides verbal and written material to individual with discussion of infection control including proper hand washing and proper equipment cleaning during exercise session.      Cardiac Rehab from 04/06/2015 in [facility] Cardiac Rehab   Date  03/01/15   Educator  C. [place]   Instruction Review Code  1- partially meets, needs review/practice      Falls Prevention: - Provides verbal and written material to individual with discussion of falls prevention and safety.      Cardiac Rehab from 04/06/2015 in [facility] Cardiac Rehab  Date  03/01/15   Educator  C. [place]   Instruction Review Code  1- partially meets, needs review/practice      Diabetes: - Individual verbal and written instruction to review signs/symptoms of diabetes, desired ranges of glucose level fasting, after meals and with exercise. Advice that pre and post exercise glucose checks will be done for 3 sessions at entry of program.    Knowledge Questionnaire Score:     Knowledge Questionnaire Score - 04/06/15 1047    Knowledge Questionnaire Score   Post Score 24      Personal Goals and Risk Factors at Admission:     Personal Goals and Risk Factors at Admission - 03/01/15 1039    Personal Goals and Risk Factors on  Admission   Increase Aerobic Exercise and Physical Activity Yes   Intervention While in program, learn and follow the exercise prescription taught. Start at a low level workload and increase workload after able to maintain previous level for 30 minutes. Increase time before increasing intensity.   Diabetes No   Hypertension No   Lipids Yes   Goal Cholesterol controlled with medications as prescribed, with individualized exercise RX and with personalized nutrition plan. Value goals: LDL < $Rem'70mg'EbkG$ , HDL > $Rem'40mg'eFUx$ . Participant states understanding of desired cholesterol values and following prescriptions.   Intervention Provide nutrition & aerobic exercise along with prescribed medications to achieve LDL '70mg'$ , HDL >$Remo'40mg'mUXeh$ .      Personal Goals and Risk Factors Review:      Goals and Risk Factor Review      03/11/15 0927 04/06/15 1042         Increase Aerobic Exercise and Physical Activity   Goals Progress/Improvement seen  Yes Yes      Comments Lashunda states doing well with the exercise and that it helps her in her daily activity. She is exercising Tuesday and Thursday at home 30-6- minutes on a stationary bike. RPE 13.    Ardelia said she is still using her stationary bike. Haddy has a $20 copay for each Cardiac Rehab visit so she opted for today to be her last day. 109minute walk done today. [address] set her up with a [employer]neakers orientation appt in our other gym.       Abnormal Lipids   Progress seen towards goals Yes Unknown      Comments Kimberlyann is on medication for cholesterol control. She is working on daily nutrition control of saturated fats and sodium. Has an appointment with the RD next week.  Will have blood work in the future.          Personal Goals Discharge (Final Personal Goals and Risk Factors Review):      Goals and Risk Factor Review - 04/06/15 1042    Increase Aerobic Exercise and Physical Activity   Goals Progress/Improvement seen  Yes   Comments Jourden said she is  still using her stationary bike. Brittnae has a $20 copay for each Cardiac Rehab visit so she opted for today to be her last day. 66minute walk done today. [address] set her up with a Silver Sne[employer] orientation appt in our other gym.    Abnormal Lipids   Progress seen towards goals Unknown   Comments Will have blood work in the future.        Comments: Did 23/36 sessions of Cardiac Rehab due to $20 copay each visit.

## 2015-04-12 ENCOUNTER — Encounter: Payer: Self-pay | Admitting: Emergency Medicine

## 2015-04-12 ENCOUNTER — Emergency Department
Admission: EM | Admit: 2015-04-12 | Discharge: 2015-04-12 | Disposition: A | Discharge status: Home / Self Care | Payer: Medicare PPO | Attending: Emergency Medicine | Admitting: Emergency Medicine

## 2015-04-12 DIAGNOSIS — Z79899 Other long term (current) drug therapy: Secondary | ICD-10-CM | POA: Insufficient documentation

## 2015-04-12 DIAGNOSIS — Z7982 Long term (current) use of aspirin: Secondary | ICD-10-CM | POA: Insufficient documentation

## 2015-04-12 DIAGNOSIS — R040 Epistaxis: Secondary | ICD-10-CM | POA: Diagnosis not present

## 2015-04-12 MED ORDER — OXYMETAZOLINE HCL 0.05 % NA SOLN
1.0000 | Freq: Once | NASAL | Status: AC
  Administered 2015-04-12: 1 via NASAL

## 2015-04-12 MED ORDER — OXYMETAZOLINE HCL 0.05 % NA SOLN
NASAL | Status: AC
  Administered 2015-04-12: 1 via NASAL
  Filled 2015-04-12: qty 15

## 2015-04-12 NOTE — ED Provider Notes (Signed)
CSN: [id number]     Arrival date & time 04/12/15  1830 History   None    Chief Complaint  Patient presents with  . Epistaxis     (Consider location/radiation/quality/duration/timing/severity/associated sxs/prior Treatment) HPI  64 year old female on Plavix and aspirin comes in today for evaluation of right-sided epistasis. Nosebleed began at approximately 3:30 PM. No trauma. Patient states she sneezed, rupture nose and developed a mild nosebleed. Patient had continued mild bleeding for 30 minutes despite pressure and forward head tilt. Patient decided to come to the emergency department after intermittent spotting over the last hour. Patient denies any blood down the back of the throat. She denies any headaches chest pain or shortness of breath. Bleeding is now controlled.  History reviewed. No pertinent past medical history. Past Surgical History  Procedure Laterality Date  . Colon surgery    . Cardiac catheterization N/A 02/02/2015    Procedure: Left Heart Cath and Coronary Angiography;  Surgeon: Marcina MillardAlexander Paraschos, MD;  [facility];  Service: Cardiovascular;  Laterality: N/A;  . Cardiac catheterization N/A 02/02/2015    Procedure: Coronary Stent Intervention;  Surgeon: Marcina MillardAlexander Paraschos, MD;  Location: [facility] INVASIVE CV LAB;  Service: Cardiovascular;  Laterality: N/A;  . Stents     No family history on file. Social History  Substance Use Topics  . Smoking status: Never Smoker   . Smokeless tobacco: None  . Alcohol Use: No   OB History    No data available     Review of Systems  Constitutional: Negative for fever, chills, activity change and fatigue.  HENT: Positive for nosebleeds. Negative for congestion, sinus pressure and sore throat.   Eyes: Negative for visual disturbance.  Respiratory: Negative for cough, chest tightness and shortness of breath.   Cardiovascular: Negative for chest pain and leg swelling.  Gastrointestinal: Negative for nausea,  vomiting, abdominal pain and diarrhea.  Genitourinary: Negative for dysuria.  Musculoskeletal: Negative for arthralgias and gait problem.  Skin: Negative for rash.  Neurological: Negative for weakness, numbness and headaches.  Hematological: Negative for adenopathy.  Psychiatric/Behavioral: Negative for behavioral problems, confusion and agitation.      Allergies  Review of patient's allergies indicates no known allergies.  Home Medications   Prior to Admission medications   Medication Sig Start Date End Date Taking? Authorizing Provider  aspirin EC 81 MG EC tablet Take 1 tablet (81 mg total) by mouth daily. 02/03/15   Enid Baasadhika Kalisetti, MD  atorvastatin (LIPITOR) 80 MG tablet Take 1 tablet (80 mg total) by mouth daily at 6 PM. 02/03/15   Enid Baasadhika Kalisetti, MD  calcium-vitamin D (OSCAL WITH D) 500-200 MG-UNIT per tablet Take 1 tablet by mouth.    Historical Provider, MD  clopidogrel (PLAVIX) 75 MG tablet Take 1 tablet (75 mg total) by mouth daily. 02/03/15   Enid Baasadhika Kalisetti, MD  KRILL OIL OMEGA-3 PO Take 1 capsule by mouth daily.    Historical Provider, MD  metoprolol tartrate (LOPRESSOR) 25 MG tablet Take 1 tablet (25 mg total) by mouth 2 (two) times daily. 02/03/15   Enid Baasadhika Kalisetti, MD  nitroGLYCERIN (NITROSTAT) 0.4 MG SL tablet Place 1 tablet (0.4 mg total) under the tongue every 5 (five) minutes as needed for chest pain. 02/03/15   Enid Baasadhika Kalisetti, MD   BP 150/75 mmHg  Pulse 63  Temp(Src) 97.5 F (36.4 C) (Oral)  Resp 18  Ht 5\' 9"  (1.753 m)  Wt 60.782 kg  BMI 19.78 kg/m2  SpO2 100% Physical Exam  Constitutional: She is  oriented to person, place, and time. She appears well-developed and well-nourished.  HENT:  Head: Normocephalic and atraumatic.  Nose: No mucosal edema, rhinorrhea, nose lacerations, sinus tenderness, septal deviation or nasal septal hematoma. Epistaxis (no active bleeding, dried blood in the right nare) is observed. Right sinus exhibits no maxillary sinus  tenderness and no frontal sinus tenderness. Left sinus exhibits no maxillary sinus tenderness and no frontal sinus tenderness.  Mouth/Throat: Oropharynx is clear and moist. No oropharyngeal exudate.  Neck: Normal range of motion. Neck supple.  Cardiovascular: Normal rate and intact distal pulses.   Pulmonary/Chest: Effort normal. No respiratory distress.  Musculoskeletal: Normal range of motion. She exhibits no edema or tenderness.  Lymphadenopathy:    She has no cervical adenopathy.  Neurological: She is alert and oriented to person, place, and time.  Skin: Skin is warm and dry. No rash noted.  Psychiatric: She has a normal mood and affect. Her behavior is normal. Judgment and thought content normal.    ED Course  Procedures (including critical care time) Labs Review Labs Reviewed - No data to display  Imaging Review No results found. I have personally reviewed and evaluated these images and lab results as part of my medical decision-making.   EKG Interpretation None      MDM   Final diagnoses:  Anterior epistaxis    64 year old female with acute right-sided epistasis after sneezing and rubbing her nose. Bleeding is controlled. Right nare was packed with Afrin soaked gauze for 5 minutes. Gauze was removed. There was no active bleeding. Patient will use Afrin nasal spray daily for the next 2 days and transition over to normal saline spray to help moisturize the vessels. She will avoid blowing her nose. Also recommend humidifier. Patient was educated on how to treat epistasis. She'll return to the ER if continued bleeding after 2 trials of 20 minutes of compression.  Evon Slack, PA-C 04/12/15 1901  Arnaldo Natal, MD 04/12/15 2102

## 2015-04-12 NOTE — ED Notes (Addendum)
C/o nose bleed since 1530 today, states she is currently taking plavix, not currently bleeding

## 2015-04-12 NOTE — ED Notes (Signed)
Pt was being discharged and stood up to leave and nose started bleeding. PA applied clamp to nose. Bleeding controlled. Will monitor pt.

## 2015-04-12 NOTE — ED Notes (Signed)
No bleeding noted at this time. Okay for pt to leave per PA.

## 2015-04-12 NOTE — Discharge Instructions (Signed)

## 2015-12-21 ENCOUNTER — Other Ambulatory Visit: Payer: Self-pay | Admitting: Orthopedic Surgery

## 2015-12-21 DIAGNOSIS — M25551 Pain in right hip: Secondary | ICD-10-CM

## 2016-08-16 IMAGING — CR DG CHEST 1V PORT
1 series · 1 of 1 positions shown · non-contrast
Comparison: None.

CLINICAL DATA: Acute chest pain for 2 days.

EXAM:
PORTABLE CHEST - 1 VIEW

[ap]
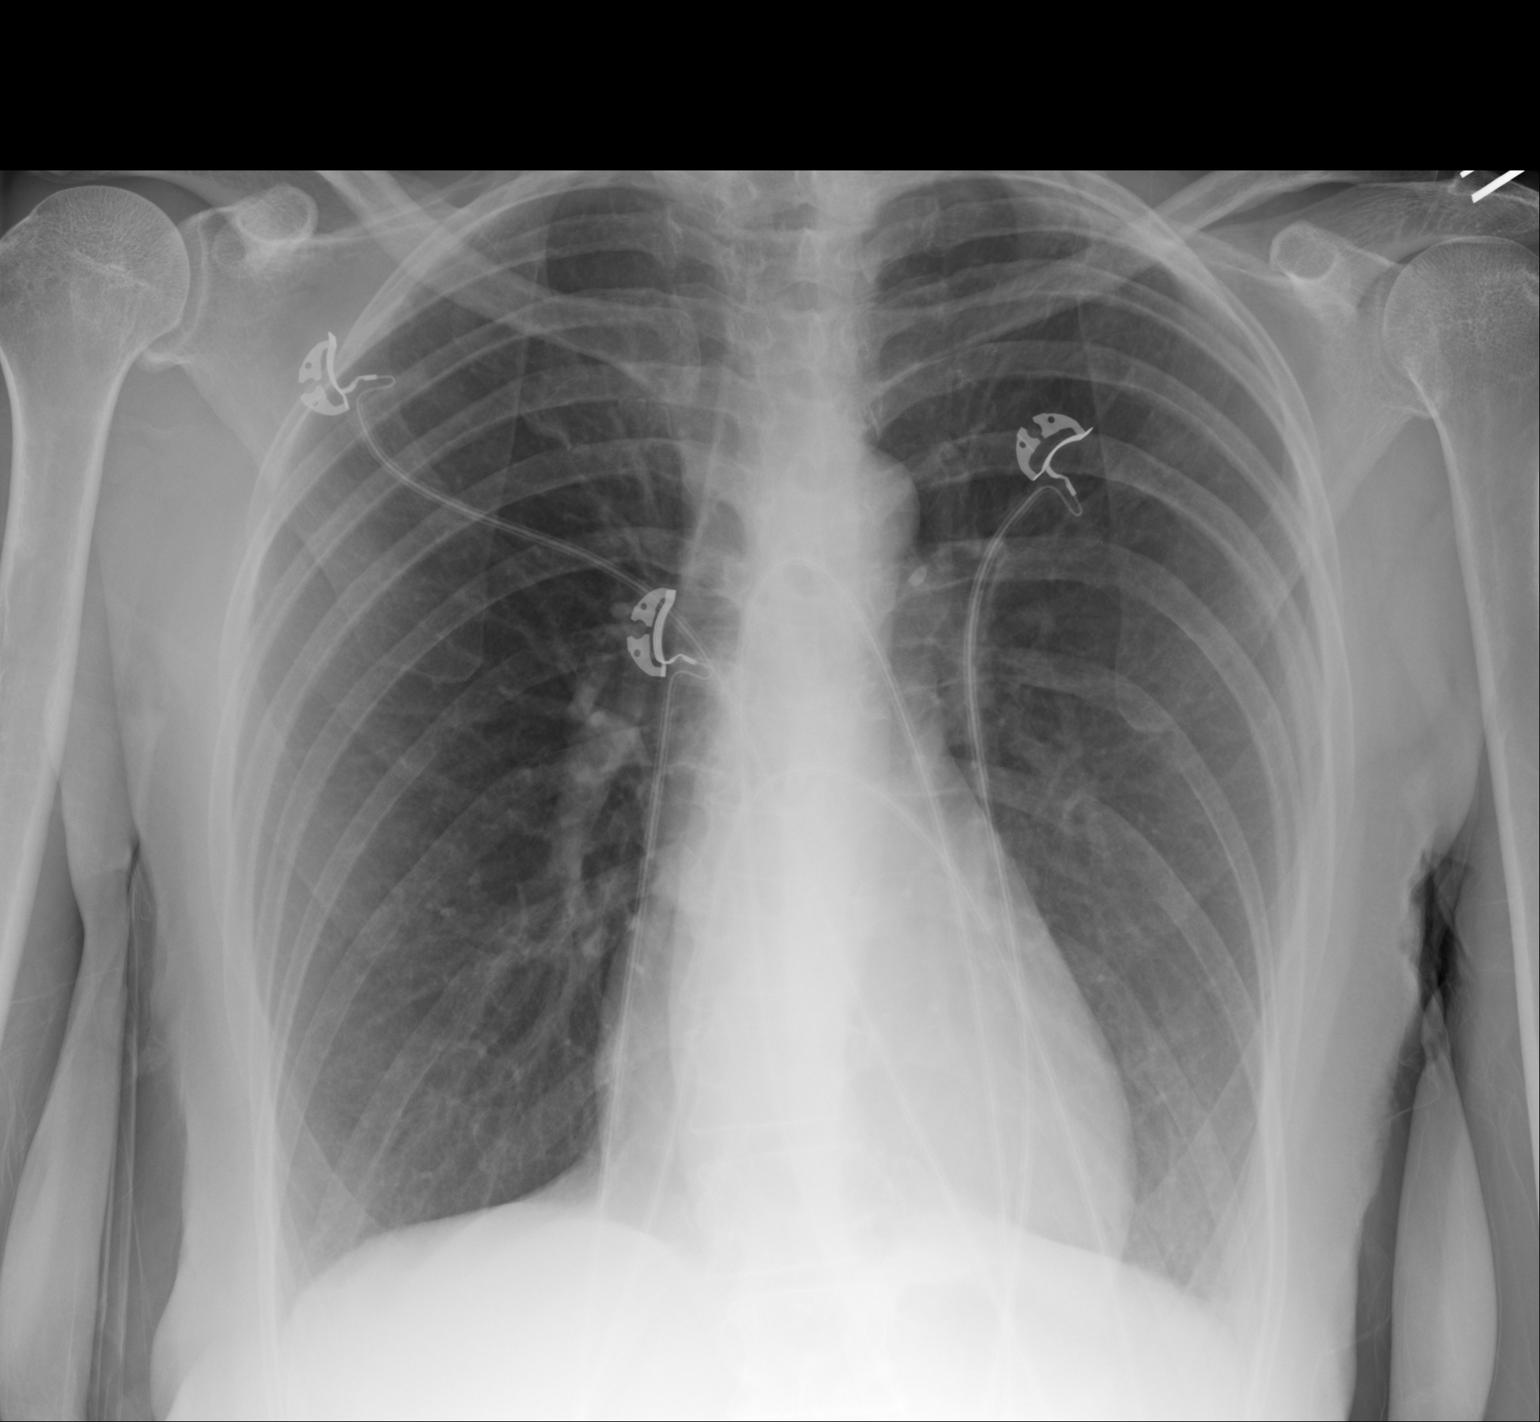

[1 of 1 positions shown; findings below may reference images not displayed]

FINDINGS: The cardiomediastinal silhouette is unremarkable.

There is no evidence of focal airspace disease, pulmonary edema,
suspicious pulmonary nodule/mass, pleural effusion, or pneumothorax.
No acute bony abnormalities are identified.
IMPRESSION: No active disease.

## 2018-11-09 ENCOUNTER — Emergency Department: Payer: Medicare Other

## 2018-11-09 ENCOUNTER — Encounter: Payer: Self-pay | Admitting: Emergency Medicine

## 2018-11-09 ENCOUNTER — Encounter
Admission: EM | Disposition: A | Discharge status: Home / Self Care | Payer: Self-pay | Source: Home / Self Care | Attending: Emergency Medicine

## 2018-11-09 ENCOUNTER — Other Ambulatory Visit: Payer: Self-pay

## 2018-11-09 ENCOUNTER — Emergency Department
Admission: EM | Admit: 2018-11-09 | Discharge: 2018-11-09 | Disposition: A | Discharge status: Home / Self Care | Payer: Medicare Other | Attending: Emergency Medicine | Admitting: Emergency Medicine

## 2018-11-09 DIAGNOSIS — K222 Esophageal obstruction: Secondary | ICD-10-CM | POA: Diagnosis not present

## 2018-11-09 DIAGNOSIS — Z955 Presence of coronary angioplasty implant and graft: Secondary | ICD-10-CM | POA: Insufficient documentation

## 2018-11-09 DIAGNOSIS — I252 Old myocardial infarction: Secondary | ICD-10-CM | POA: Diagnosis not present

## 2018-11-09 DIAGNOSIS — X58XXXA Exposure to other specified factors, initial encounter: Secondary | ICD-10-CM | POA: Diagnosis not present

## 2018-11-09 DIAGNOSIS — I2510 Atherosclerotic heart disease of native coronary artery without angina pectoris: Secondary | ICD-10-CM | POA: Diagnosis not present

## 2018-11-09 DIAGNOSIS — T18128A Food in esophagus causing other injury, initial encounter: Secondary | ICD-10-CM | POA: Diagnosis not present

## 2018-11-09 DIAGNOSIS — Z79899 Other long term (current) drug therapy: Secondary | ICD-10-CM | POA: Insufficient documentation

## 2018-11-09 DIAGNOSIS — Z1159 Encounter for screening for other viral diseases: Secondary | ICD-10-CM | POA: Insufficient documentation

## 2018-11-09 DIAGNOSIS — I10 Essential (primary) hypertension: Secondary | ICD-10-CM | POA: Diagnosis not present

## 2018-11-09 DIAGNOSIS — Z7982 Long term (current) use of aspirin: Secondary | ICD-10-CM | POA: Diagnosis not present

## 2018-11-09 DIAGNOSIS — T18108A Unspecified foreign body in esophagus causing other injury, initial encounter: Secondary | ICD-10-CM | POA: Diagnosis present

## 2018-11-09 HISTORY — DX: Essential (primary) hypertension: I10

## 2018-11-09 HISTORY — DX: Acute myocardial infarction, unspecified: I21.9

## 2018-11-09 HISTORY — PX: ESOPHAGOGASTRODUODENOSCOPY (EGD) WITH PROPOFOL: SHX5813

## 2018-11-09 LAB — SARS CORONAVIRUS 2 BY RT PCR (HOSPITAL ORDER, PERFORMED IN ~~LOC~~ HOSPITAL LAB): SARS Coronavirus 2: NEGATIVE

## 2018-11-09 SURGERY — ESOPHAGOGASTRODUODENOSCOPY (EGD) WITH PROPOFOL
Anesthesia: General

## 2018-11-09 MED ORDER — FENTANYL CITRATE (PF) 100 MCG/2ML IJ SOLN: 25.0000 ug | INTRAMUSCULAR | Status: DC | PRN

## 2018-11-09 MED ORDER — SODIUM CHLORIDE 0.9 % IV SOLN
INTRAVENOUS | Status: DC
  Administered 2018-11-09: 21:00:00 via INTRAVENOUS

## 2018-11-09 MED ORDER — PROPOFOL 10 MG/ML IV BOLUS
INTRAVENOUS | Status: AC
  Filled 2018-11-09: qty 20

## 2018-11-09 MED ORDER — SUCCINYLCHOLINE CHLORIDE 20 MG/ML IJ SOLN
INTRAMUSCULAR | Status: DC | PRN
  Administered 2018-11-09: 100 mg via INTRAVENOUS

## 2018-11-09 MED ORDER — LIDOCAINE HCL (CARDIAC) PF 100 MG/5ML IV SOSY
PREFILLED_SYRINGE | INTRAVENOUS | Status: DC | PRN
  Administered 2018-11-09: 50 mg via INTRAVENOUS

## 2018-11-09 MED ORDER — SODIUM CHLORIDE 0.9 % IV SOLN
Freq: Once | INTRAVENOUS | Status: DC
Start: 2018-11-09 — End: 2018-11-10

## 2018-11-09 MED ORDER — PROPOFOL 10 MG/ML IV BOLUS
INTRAVENOUS | Status: DC | PRN
  Administered 2018-11-09: 120 mg via INTRAVENOUS

## 2018-11-09 MED ORDER — ONDANSETRON HCL 4 MG/2ML IJ SOLN: 4.0000 mg | Freq: Once | INTRAMUSCULAR | Status: DC | PRN

## 2018-11-09 NOTE — ED Triage Notes (Signed)
STates has a piece of steak stuck in esophagus.  States occurred approximately 1 hour PTA.  Unable to swallow anything.   Voice clear and strong.  No SOB/ DOE

## 2018-11-09 NOTE — Anesthesia Procedure Notes (Signed)
Procedure Name: Intubation Date/Time: 11/09/2018 9:50 PM Performed by: Lendon Colonel, CRNA Pre-anesthesia Checklist: Patient identified, Patient being monitored, Timeout performed, Emergency Drugs available and Suction available Patient Re-evaluated:Patient Re-evaluated prior to induction Oxygen Delivery Method: Circle system utilized Preoxygenation: Pre-oxygenation with 100% oxygen Induction Type: IV induction, Rapid sequence and Cricoid Pressure applied Laryngoscope Size: Mac and 3 Grade View: Grade I Tube type: Oral Tube size: 7.0 mm Number of attempts: 1 Airway Equipment and Method: Stylet Placement Confirmation: ETT inserted through vocal cords under direct vision,  positive ETCO2 and breath sounds checked- equal and bilateral Secured at: 21 cm Tube secured with: Tape Dental Injury: Teeth and Oropharynx as per pre-operative assessment

## 2018-11-09 NOTE — Progress Notes (Signed)
Pt A&O arriving to PACU.  Denies any pain. VSS. Tolerating ice chips.  Discussed d/c instructions with pt and husband over the phone.  F/U with [facility] for future colonoscopy and repeat EGD.  No c/o's. Verbalized understanding of remaining on mechanical soft diet.  Bubba Camp, RN

## 2018-11-09 NOTE — ED Notes (Signed)
Updated pt on awaiting gi consult

## 2018-11-09 NOTE — Transfer of Care (Signed)
Immediate Anesthesia Transfer of Care Note  Patient: Kristina Macias  Procedure(s) Performed: ESOPHAGOGASTRODUODENOSCOPY (EGD) WITH PROPOFOL (N/A )  Patient Location: PACU  Anesthesia Type:General  Level of Consciousness: awake, alert , oriented and patient cooperative  Airway & Oxygen Therapy: Patient Spontanous Breathing and Patient connected to face mask oxygen  Post-op Assessment: Report given to RN and Post -op Vital signs reviewed and stable  Post vital signs: Reviewed and stable  Last Vitals:  Vitals Value Taken Time  BP    Temp    Pulse    Resp    SpO2      Last Pain:  Vitals:   11/09/18 2056  TempSrc: Tympanic  PainSc: 0-No pain         Complications: No apparent anesthesia complications

## 2018-11-09 NOTE — Anesthesia Post-op Follow-up Note (Signed)
Anesthesia QCDR form completed.        

## 2018-11-09 NOTE — ED Provider Notes (Signed)
[facility] Emergency Department Provider Note  ____________________________________________   I have reviewed the triage vital signs and the nursing notes. Where available I have reviewed prior notes and, if possible and indicated, outside hospital notes.    HISTORY  Chief Complaint esophageal obstruction    HPI BLAKLEIGH STRAW is a 68 y.o. female patient seen and evaluated during the coronavirus epidemic during a time with low staffing, history of CAD, has progressive occultly swallowing over the last several months where things seem to get "hung up" at the end of her esophagus near her stomach but never been obstructed before.  This afternoon around 2 she had a large piece of meat, and felt the meat get stuck.  The meat is now stuck.  She try drinking Coca[employer] did not go down.  Is not having any significant pain, she is not vomited.  She did not have any trouble breathing.  However, she is unable to swallow her own saliva.     History reviewed. No pertinent past medical history.  Patient Active Problem List   Diagnosis Date Noted  . NSTEMI (non-ST elevated myocardial infarction) ([place]) 02/01/2015    Past Surgical History:  Procedure Laterality Date  . CARDIAC CATHETERIZATION N/A 02/02/2015   Procedure: Left Heart Cath and Coronary Angiography;  Surgeon: Isaias Cowman, MD;  Location: [place] CV LAB;  Service: Cardiovascular;  Laterality: N/A;  . CARDIAC CATHETERIZATION N/A 02/02/2015   Procedure: Coronary Stent Intervention;  Surgeon: Isaias Cowman, MD;  Location: [facility] CV LAB;  Service: Cardiovascular;  Laterality: N/A;  . COLON SURGERY    . stents      Prior to Admission medications   Medication Sig Start Date End Date Taking? Authorizing Provider  aspirin EC 81 MG EC tablet Take 1 tablet (81 mg total) by mouth daily. 02/03/15   Gladstone Lighter, MD  atorvastatin (LIPITOR) 80 MG tablet Take 1 tablet (80 mg total) by mouth  daily at 6 PM. 02/03/15   Gladstone Lighter, MD  calcium-vitamin D (OSCAL WITH D) 500-200 MG-UNIT per tablet Take 1 tablet by mouth.    [provider]  clopidogrel (PLAVIX) 75 MG tablet Take 1 tablet (75 mg total) by mouth daily. 02/03/15   Gladstone Lighter, MD  KRILL OIL OMEGA-3 PO Take 1 capsule by mouth daily.    [provider]  metoprolol tartrate (LOPRESSOR) 25 MG tablet Take 1 tablet (25 mg total) by mouth 2 (two) times daily. 02/03/15   Gladstone Lighter, MD  nitroGLYCERIN (NITROSTAT) 0.4 MG SL tablet Place 1 tablet (0.4 mg total) under the tongue every 5 (five) minutes as needed for chest pain. 02/03/15   Gladstone Lighter, MD    Allergies Patient has no known allergies.  No family history on file.  Social History Social History   Tobacco Use  . Smoking status: Never Smoker  . Smokeless tobacco: Never Used  Substance Use Topics  . Alcohol use: No  . Drug use: Not on file    Review of Systems Constitutional: No fever/chills Eyes: No visual changes. ENT: No sore throat. No stiff neck no neck pain Cardiovascular: Denies chest pain. Respiratory: Denies shortness of breath. Gastrointestinal:   no vomiting.  No diarrhea.  No constipation. Genitourinary: Negative for dysuria. Musculoskeletal: Negative lower extremity swelling Skin: Negative for rash. Neurological: Negative for severe headaches, focal weakness or numbness.   ____________________________________________   PHYSICAL EXAM:  VITAL SIGNS: ED Triage Vitals  Enc Vitals Group     BP 11/09/18  1529 (!) 145/74     Pulse Rate 11/09/18 1529 71     Resp 11/09/18 1529 18     Temp 11/09/18 1529 98.4 F (36.9 C)     Temp Source 11/09/18 1529 Oral     SpO2 11/09/18 1529 99 %     Weight 11/09/18 1524 134 lb 0.6 oz (60.8 kg)     Height 11/09/18 1529 5\' 9"  (1.753 m)     Head Circumference --      Peak Flow --      Pain Score 11/09/18 1524 0     Pain Loc --      Pain Edu? --      Excl. in GC?  --     Constitutional: Alert and oriented. Well appearing and in no acute distress. Eyes: Conjunctivae are normal Head: Atraumatic HEENT: No congestion/rhinnorhea. Mucous membranes are moist.  Oropharynx non-erythematous Neck:   Nontender with no meningismus, no masses, no stridor Cardiovascular: Normal rate, regular rhythm. Grossly normal heart sounds.  Good peripheral circulation. Respiratory: Normal respiratory effort.  No retractions. Lungs CTAB. Abdominal: Soft and nontender. No distention. No guarding no rebound Back:  There is no focal tenderness or step off.  there is no midline tenderness there are no lesions noted. there is no CVA tenderness  Musculoskeletal: No lower extremity tenderness, no upper extremity tenderness. No joint effusions, no DVT signs strong distal pulses no edema Neurologic:  Normal speech and language. No gross focal neurologic deficits are appreciated.  Skin:  Skin is warm, dry and intact. No rash noted. Psychiatric: Mood and affect are normal. Speech and behavior are normal.  ____________________________________________   LABS (all labs ordered are listed, but only abnormal results are displayed)  Labs Reviewed - No data to display  Pertinent labs  results that were available during my care of the patient were reviewed by me and considered in my medical decision making (see chart for details). ____________________________________________  EKG  I personally interpreted any EKGs ordered by me or triage  ____________________________________________  RADIOLOGY  Pertinent labs & imaging results that were available during my care of the patient were reviewed by me and considered in my medical decision making (see chart for details). If possible, patient and/or family made aware of any abnormal findings.  No results found. ____________________________________________    PROCEDURES  Procedure(s) performed: None  Procedures  Critical Care  performed: None  ____________________________________________   INITIAL IMPRESSION / ASSESSMENT AND PLAN / ED COURSE  Pertinent labs & imaging results that were available during my care of the patient were reviewed by me and considered in my medical decision making (see chart for details).  With all the signs and symptoms of a meat impaction, likely distal esophagus, nothing to suggest ACS PE dissection etc.  Nothing to suggest Boerhaave's.  Discussed with Dr. Servando SnareWohl, who is happy to come and extricate the obstructing particle however, by hospital policy she has to be coronavirus negative prior to any procedure, we will test that.  She is in no acute distress.  We will keep her n.p.o. obviously pending procedure.  Dr. Servando SnareWohl does not want any blood work or other preop.  Patient is not blood thinners.    ____________________________________________   FINAL CLINICAL IMPRESSION(S) / ED DIAGNOSES  Final diagnoses:  None      This chart was dictated using voice recognition software.  Despite best efforts to proofread,  errors can occur which can change meaning.      Ileana RoupMcShane, Kamille Toomey  A, MD 11/09/18 1639

## 2018-11-09 NOTE — Anesthesia Preprocedure Evaluation (Addendum)
Anesthesia Evaluation  Patient identified by MRN, date of birth, ID band Patient awake    Reviewed: Allergy & Precautions, NPO status , Patient's Chart, lab work & pertinent test results, reviewed documented beta blocker date and time   Airway Mallampati: II  TM Distance: >3 FB     Dental  (+) Upper Dentures, Lower Dentures   Pulmonary neg pulmonary ROS,    Pulmonary exam normal        Cardiovascular hypertension, Pt. on medications and Pt. on home beta blockers + CAD and + Past MI  Normal cardiovascular exam     Neuro/Psych negative neurological ROS  negative psych ROS   GI/Hepatic Neg liver ROS,   Endo/Other  negative endocrine ROS  Renal/GU negative Renal ROS  negative genitourinary   Musculoskeletal negative musculoskeletal ROS (+)   Abdominal Normal abdominal exam  (+)   Peds negative pediatric ROS (+)  Hematology negative hematology ROS (+)   Anesthesia Other Findings History reviewed. No pertinent past medical history.  Reproductive/Obstetrics                            Anesthesia Physical Anesthesia Plan  ASA: III and emergent  Anesthesia Plan: General   Post-op Pain Management:    Induction: Intravenous, Rapid sequence and Cricoid pressure planned  PONV Risk Score and Plan:   Airway Management Planned: Oral ETT  Additional Equipment:   Intra-op Plan:   Post-operative Plan: Extubation in OR  Informed Consent: I have reviewed the patients History and Physical, chart, labs and discussed the procedure including the risks, benefits and alternatives for the proposed anesthesia with the patient or authorized representative who has indicated his/her understanding and acceptance.     Dental advisory given  Plan Discussed with: CRNA and Surgeon  Anesthesia Plan Comments:         Anesthesia Quick Evaluation

## 2018-11-09 NOTE — H&P (Signed)
Kristina Lame, MD [facility] [address] [place], [place] [address] [phone] Fax : [phone]  Primary Care Physician:  Idelle Crouch, MD Primary Gastroenterologist:  Dr. Allen Norris  Pre-Procedure History & Physical: HPI:  Kristina Macias is a 68 y.o. female is here for an endoscopy.   History reviewed. No pertinent past medical history.  Past Surgical History:  Procedure Laterality Date  . CARDIAC CATHETERIZATION N/A 02/02/2015   Procedure: Left Heart Cath and Coronary Angiography;  Surgeon: Isaias Cowman, MD;  Location: [place] CV LAB;  Service: Cardiovascular;  Laterality: N/A;  . CARDIAC CATHETERIZATION N/A 02/02/2015   Procedure: Coronary Stent Intervention;  Surgeon: Isaias Cowman, MD;  Location: [place] CV LAB;  Service: Cardiovascular;  Laterality: N/A;  . COLON SURGERY    . stents      Prior to Admission medications   Medication Sig Start Date End Date Taking? Authorizing Provider  aspirin EC 81 MG EC tablet Take 1 tablet (81 mg total) by mouth daily. 02/03/15   Gladstone Lighter, MD  atorvastatin (LIPITOR) 80 MG tablet Take 1 tablet (80 mg total) by mouth daily at 6 PM. 02/03/15   Gladstone Lighter, MD  calcium-vitamin D (OSCAL WITH D) 500-200 MG-UNIT per tablet Take 1 tablet by mouth.    [provider]  clopidogrel (PLAVIX) 75 MG tablet Take 1 tablet (75 mg total) by mouth daily. 02/03/15   Gladstone Lighter, MD  KRILL OIL OMEGA-3 PO Take 1 capsule by mouth daily.    [provider]  metoprolol tartrate (LOPRESSOR) 25 MG tablet Take 1 tablet (25 mg total) by mouth 2 (two) times daily. 02/03/15   Gladstone Lighter, MD  nitroGLYCERIN (NITROSTAT) 0.4 MG SL tablet Place 1 tablet (0.4 mg total) under the tongue every 5 (five) minutes as needed for chest pain. 02/03/15   Gladstone Lighter, MD    Allergies as of 11/09/2018  . (No Known Allergies)    History reviewed. No pertinent family history.  Social History    Socioeconomic History  . Marital status: Married    Spouse name: Not on file  . Number of children: Not on file  . Years of education: Not on file  . Highest education level: Not on file  Occupational History  . Not on file  Social Needs  . Financial resource strain: Not on file  . Food insecurity    Worry: Not on file    Inability: Not on file  . Transportation needs    Medical: Not on file    Non-medical: Not on file  Tobacco Use  . Smoking status: Never Smoker  . Smokeless tobacco: Never Used  Substance and Sexual Activity  . Alcohol use: No  . Drug use: Not on file  . Sexual activity: Not on file  Lifestyle  . Physical activity    Days per week: Not on file    Minutes per session: Not on file  . Stress: Not on file  Relationships  . Social Herbalist on phone: Not on file    Gets together: Not on file    Attends religious service: Not on file    Active member of club or organization: Not on file    Attends meetings of clubs or organizations: Not on file    Relationship status: Not on file  . Intimate partner violence    Fear of current or ex partner: Not on file    Emotionally abused: Not on file  Physically abused: Not on file    Forced sexual activity: Not on file  Other Topics Concern  . Not on file  Social History Narrative  . Not on file    Review of Systems: See HPI, otherwise negative ROS  Physical Exam: BP 138/64   Pulse 84   Temp (!) 97 F (36.1 C) (Tympanic)   Resp 18   Ht 5\' 9"  (1.753 m)   Wt 65.8 kg   SpO2 100%   BMI 21.41 kg/m  General:   Alert,  pleasant and cooperative in NAD Head:  Normocephalic and atraumatic. Neck:  Supple; no masses or thyromegaly. Lungs:  Clear throughout to auscultation.    Heart:  Regular rate and rhythm. Abdomen:  Soft, nontender and nondistended. Normal bowel sounds, without guarding, and without rebound.   Neurologic:  Alert and  oriented x4;  grossly normal neurologically.   Impression/Plan: Danella DeisJanice J Wendt is here for an endoscopy to be performed for foreign body removal  Risks, benefits, limitations, and alternatives regarding  endoscopy have been reviewed with the patient.  Questions have been answered.  All parties agreeable.   Midge Miniumarren Ranald Alessio, MD  11/09/2018, 9:04 PM

## 2018-11-09 NOTE — Op Note (Signed)
[facility] Gastroenterology Patient Name: Kristina PancakeJanice Hammill Procedure Date: 11/09/2018 9:37 PM MRN: [id number] Account #: [id number] Date of Birth: Feb 25, 1951 Admit Type: Inpatient Age: 68 Room[facility] ENDO ROOM 4 Gender: Female Note Status: Finalized Procedure:            Upper GI endoscopy Indications:          Foreign body in the esophagus Providers:            Midge Miniumarren Ashtyn Meland MD, MD Medicines:            General Anesthesia Complications:        No immediate complications. Procedure:            Pre-Anesthesia Assessment:                       - Prior to the procedure, a History and Physical was                        performed, and patient medications and allergies were                        reviewed. The patient's tolerance of previous                        anesthesia was also reviewed. The risks and benefits of                        the procedure and the sedation options and risks were                        discussed with the patient. All questions were                        answered, and informed consent was obtained. Prior                        Anticoagulants: The patient has taken no previous                        anticoagulant or antiplatelet agents. ASA Grade                        Assessment: II - A patient with mild systemic disease.                        After reviewing the risks and benefits, the patient was                        deemed in satisfactory condition to undergo the                        procedure.                       After obtaining informed consent, the endoscope was                        passed under direct vision. Throughout the procedure,                        the patient's blood  pressure, pulse, and oxygen                        saturations were monitored continuously. The Endoscope                        was introduced through the mouth, and advanced to the                        second part of duodenum. The upper GI endoscopy  was                        accomplished without difficulty. The patient tolerated                        the procedure well. Findings:      One benign-appearing, intrinsic moderate stenosis was found at the       gastroesophageal junction. The stenosis was traversed.      Food (residue) was found in the entire examined stomach.      The examined duodenum was normal. Impression:           - Benign-appearing esophageal stenosis.                       - Food (residue) in the stomach.                       - Normal examined duodenum.                       - No specimens collected.                       - The food had spontanously passed. Recommendation:       - Discharge patient to home.                       - Mechanical soft diet.                       - Continue present medications.                       - The patient is due for a colonoscopy and should have                        an EGD with dilation at that time when the swelling has                        gone down. Procedure Code(s):    --- Professional ---                       [phone] [id number], Esophagogastroduodenoscopy, flexible, transoral;                        diagnostic, including collection of specimen(s) by                        brushing or washing, when performed (separate procedure) Diagnosis Code(s):    --- Professional ---  T18.108A, Unspecified foreign body in esophagus causing                        other injury, initial encounter                       K22.2, Esophageal obstruction CPT copyright 2019 American Medical Association. All rights reserved. The codes documented in this report are preliminary and upon coder review may  be revised to meet current compliance requirements. [name] MD, MD 11/09/2018 9:49:48 PM This report has been signed electronically. Number of Addenda: 0 Note Initiated On: 11/09/2018 9:37 PM Estimated Blood Loss: Estimated blood loss: none.      [facility]

## 2018-11-09 NOTE — ED Notes (Signed)
Pt taken to endo - pt will sign consent there.

## 2018-11-10 ENCOUNTER — Encounter: Payer: Self-pay | Admitting: Gastroenterology

## 2018-11-11 NOTE — Anesthesia Postprocedure Evaluation (Signed)
Anesthesia Post Note  Patient: Kristina Macias  Procedure(s) Performed: ESOPHAGOGASTRODUODENOSCOPY (EGD) WITH PROPOFOL (N/A )  Patient location during evaluation: Endoscopy Anesthesia Type: General Level of consciousness: awake and alert and oriented Pain management: pain level controlled Vital Signs Assessment: post-procedure vital signs reviewed and stable Respiratory status: spontaneous breathing Cardiovascular status: blood pressure returned to baseline Anesthetic complications: no     Last Vitals:  Vitals:   11/09/18 2215 11/09/18 2230  BP: 108/68 107/62  Pulse: (!) 58   Resp:    Temp:  36.4 C  SpO2: 99%     Last Pain:  Vitals:   11/09/18 2230  TempSrc:   PainSc: 0-No pain                 Shaddai Shapley

## 2018-12-11 ENCOUNTER — Other Ambulatory Visit: Admission: RE | Admit: 2018-12-11 | Payer: Medicare Other | Source: Ambulatory Visit

## 2018-12-18 ENCOUNTER — Other Ambulatory Visit
Admission: RE | Admit: 2018-12-18 | Discharge: 2018-12-18 | Disposition: A | Discharge status: Home / Self Care | Payer: Medicare Other | Source: Ambulatory Visit | Attending: Internal Medicine | Admitting: Internal Medicine

## 2018-12-18 ENCOUNTER — Other Ambulatory Visit: Payer: Self-pay

## 2018-12-18 DIAGNOSIS — Z20828 Contact with and (suspected) exposure to other viral communicable diseases: Secondary | ICD-10-CM | POA: Insufficient documentation

## 2018-12-18 DIAGNOSIS — Z01812 Encounter for preprocedural laboratory examination: Secondary | ICD-10-CM | POA: Insufficient documentation

## 2018-12-18 LAB — SARS CORONAVIRUS 2 (TAT 6-24 HRS): SARS Coronavirus 2: NEGATIVE

## 2018-12-22 ENCOUNTER — Ambulatory Visit
Admission: RE | Admit: 2018-12-22 | Discharge: 2018-12-22 | Disposition: A | Discharge status: Home / Self Care | Payer: Medicare Other | Attending: Internal Medicine | Admitting: Internal Medicine

## 2018-12-22 ENCOUNTER — Ambulatory Visit: Payer: Medicare Other | Admitting: Anesthesiology

## 2018-12-22 ENCOUNTER — Other Ambulatory Visit: Payer: Self-pay

## 2018-12-22 ENCOUNTER — Encounter
Admission: RE | Disposition: A | Discharge status: Home / Self Care | Payer: Self-pay | Source: Home / Self Care | Attending: Internal Medicine

## 2018-12-22 DIAGNOSIS — Z09 Encounter for follow-up examination after completed treatment for conditions other than malignant neoplasm: Secondary | ICD-10-CM | POA: Insufficient documentation

## 2018-12-22 DIAGNOSIS — K2970 Gastritis, unspecified, without bleeding: Secondary | ICD-10-CM | POA: Diagnosis not present

## 2018-12-22 DIAGNOSIS — I2510 Atherosclerotic heart disease of native coronary artery without angina pectoris: Secondary | ICD-10-CM | POA: Diagnosis not present

## 2018-12-22 DIAGNOSIS — Z7902 Long term (current) use of antithrombotics/antiplatelets: Secondary | ICD-10-CM | POA: Diagnosis not present

## 2018-12-22 DIAGNOSIS — Z79899 Other long term (current) drug therapy: Secondary | ICD-10-CM | POA: Insufficient documentation

## 2018-12-22 DIAGNOSIS — Z8719 Personal history of other diseases of the digestive system: Secondary | ICD-10-CM | POA: Insufficient documentation

## 2018-12-22 DIAGNOSIS — Z7982 Long term (current) use of aspirin: Secondary | ICD-10-CM | POA: Insufficient documentation

## 2018-12-22 DIAGNOSIS — Z980 Intestinal bypass and anastomosis status: Secondary | ICD-10-CM | POA: Insufficient documentation

## 2018-12-22 DIAGNOSIS — K222 Esophageal obstruction: Secondary | ICD-10-CM | POA: Diagnosis not present

## 2018-12-22 DIAGNOSIS — K449 Diaphragmatic hernia without obstruction or gangrene: Secondary | ICD-10-CM | POA: Diagnosis not present

## 2018-12-22 DIAGNOSIS — I252 Old myocardial infarction: Secondary | ICD-10-CM | POA: Diagnosis not present

## 2018-12-22 DIAGNOSIS — I10 Essential (primary) hypertension: Secondary | ICD-10-CM | POA: Diagnosis not present

## 2018-12-22 DIAGNOSIS — R1310 Dysphagia, unspecified: Secondary | ICD-10-CM | POA: Diagnosis present

## 2018-12-22 HISTORY — PX: COLONOSCOPY WITH PROPOFOL: SHX5780

## 2018-12-22 HISTORY — PX: ESOPHAGOGASTRODUODENOSCOPY (EGD) WITH PROPOFOL: SHX5813

## 2018-12-22 SURGERY — ESOPHAGOGASTRODUODENOSCOPY (EGD) WITH PROPOFOL
Anesthesia: General

## 2018-12-22 MED ORDER — PROPOFOL 10 MG/ML IV BOLUS
INTRAVENOUS | Status: AC
  Filled 2018-12-22: qty 20

## 2018-12-22 MED ORDER — LIDOCAINE HCL (PF) 2 % IJ SOLN
INTRAMUSCULAR | Status: AC
  Filled 2018-12-22: qty 10

## 2018-12-22 MED ORDER — LIDOCAINE HCL (CARDIAC) PF 100 MG/5ML IV SOSY
PREFILLED_SYRINGE | INTRAVENOUS | Status: DC | PRN
  Administered 2018-12-22: 60 mg via INTRAVENOUS

## 2018-12-22 MED ORDER — PROPOFOL 10 MG/ML IV BOLUS
INTRAVENOUS | Status: DC | PRN
  Administered 2018-12-22: 40 ug via INTRAVENOUS
  Administered 2018-12-22: 20 ug via INTRAVENOUS
  Administered 2018-12-22: 40 ug via INTRAVENOUS
  Administered 2018-12-22: 20 ug via INTRAVENOUS
  Administered 2018-12-22: 40 ug via INTRAVENOUS
  Administered 2018-12-22: 60 ug via INTRAVENOUS
  Administered 2018-12-22: 20 ug via INTRAVENOUS

## 2018-12-22 MED ORDER — SODIUM CHLORIDE 0.9 % IV SOLN
INTRAVENOUS | Status: DC
  Administered 2018-12-22: 13:00:00 via INTRAVENOUS

## 2018-12-22 NOTE — Transfer of Care (Signed)
Immediate Anesthesia Transfer of Care Note  Patient: Kristina Macias  Procedure(s) Performed: ESOPHAGOGASTRODUODENOSCOPY (EGD) WITH PROPOFOL (N/A ) COLONOSCOPY WITH PROPOFOL (N/A )  Patient Location: Endoscopy Unit  Anesthesia Type:General  Level of Consciousness: sedated  Airway & Oxygen Therapy: Patient Spontanous Breathing and Patient connected to nasal cannula oxygen  Post-op Assessment: Report given to RN  Post vital signs: Reviewed and stable  Last Vitals:  Vitals Value Taken Time  BP    Temp    Pulse    Resp    SpO2      Last Pain:  Vitals:   12/22/18 1301  TempSrc: Tympanic         Complications: No apparent anesthesia complications

## 2018-12-22 NOTE — Anesthesia Post-op Follow-up Note (Signed)
Anesthesia QCDR form completed.        

## 2018-12-22 NOTE — Anesthesia Preprocedure Evaluation (Signed)
Anesthesia Evaluation  Patient identified by MRN, date of birth, ID band Patient awake    Reviewed: Allergy & Precautions, NPO status , Patient's Chart, lab work & pertinent test results, reviewed documented beta blocker date and time   Airway Mallampati: II  TM Distance: >3 FB     Dental  (+) Upper Dentures, Lower Dentures   Pulmonary neg pulmonary ROS,    Pulmonary exam normal        Cardiovascular hypertension, Pt. on medications and Pt. on home beta blockers + CAD and + Past MI  Normal cardiovascular exam     Neuro/Psych negative neurological ROS  negative psych ROS   GI/Hepatic Neg liver ROS,   Endo/Other  negative endocrine ROS  Renal/GU negative Renal ROS  negative genitourinary   Musculoskeletal negative musculoskeletal ROS (+)   Abdominal Normal abdominal exam  (+)   Peds negative pediatric ROS (+)  Hematology negative hematology ROS (+)   Anesthesia Other Findings History reviewed. No pertinent past medical history.  Reproductive/Obstetrics                             Anesthesia Physical  Anesthesia Plan  ASA: III and emergent  Anesthesia Plan: General   Post-op Pain Management:    Induction: Intravenous  PONV Risk Score and Plan:   Airway Management Planned: Nasal Cannula  Additional Equipment:   Intra-op Plan:   Post-operative Plan:   Informed Consent: I have reviewed the patients History and Physical, chart, labs and discussed the procedure including the risks, benefits and alternatives for the proposed anesthesia with the patient or authorized representative who has indicated his/her understanding and acceptance.     Dental advisory given  Plan Discussed with: CRNA and Surgeon  Anesthesia Plan Comments:         Anesthesia Quick Evaluation

## 2018-12-22 NOTE — Op Note (Signed)
[facility] Gastroenterology Patient Name: Kristina Macias Procedure Date: 12/22/2018 1:18 PM MRN: [id number] Account #: [id number] Date of Birth: [birth year] Admit Type: Outpatient Age: 68 Room: [facility] ENDO ROOM 1 Gender: Female Note Status: Finalized Procedure:            Colonoscopy Indications:          High risk colon cancer surveillance: Personal history                        of colonic polyps Providers:            Kristina Macias. Kristina Reichert MD, MD Referring MD:         Kristina Douglas. Doy Hutching, MD (Referring MD) Medicines:            Propofol per Anesthesia Complications:        No immediate complications. Procedure:            Pre-Anesthesia Assessment:                       - The risks and benefits of the procedure and the                        sedation options and risks were discussed with the                        patient. All questions were answered and informed                        consent was obtained.                       - Patient identification and proposed procedure were                        verified prior to the procedure by the nurse. The                        procedure was verified in the procedure room.                       - ASA Grade Assessment: III - A patient with severe                        systemic disease.                       - After reviewing the risks and benefits, the patient                        was deemed in satisfactory condition to undergo the                        procedure.                       After obtaining informed consent, the colonoscope was                        passed under direct vision. Throughout the procedure,  the patient's blood pressure, pulse, and oxygen                        saturations were monitored continuously. The                        Colonoscope was introduced through the anus and                        advanced to the the ileocolonic anastomosis. The   colonoscopy was performed without difficulty. The                        patient tolerated the procedure well. The quality of                        the bowel preparation was excellent. Ileocolonic                        anastomosis were photographed. Findings:      The perianal and digital rectal examinations were normal. Pertinent       negatives include normal sphincter tone and no palpable rectal lesions.      There was evidence of a prior end-to-side ileo-colonic anastomosis in       the ascending colon. This was patent and was characterized by healthy       appearing mucosa. The anastomosis was traversed.      The exam was otherwise without abnormality on direct and retroflexion       views. Impression:           - Patent end-to-side ileo-colonic anastomosis,                        characterized by healthy appearing mucosa.                       - The examination was otherwise normal on direct and                        retroflexion views.                       - No specimens collected. Recommendation:       - Monitor results to esophageal dilation                       - Patient has a contact number available for                        emergencies. The signs and symptoms of potential                        delayed complications were discussed with the patient.                        Return to normal activities tomorrow. Written discharge                        instructions were provided to the patient.                       - Resume previous diet.                       -  Continue present medications.                       - Repeat colonoscopy in 5 years for surveillance.                       - Return to physician assistant in 3 months.                       - The findings and recommendations were discussed with                        the patient. Procedure Code(s):    --- Professional ---                       [id number], Colorectal cancer screening; colonoscopy on                         individual at high risk Diagnosis Code(s):    --- Professional ---                       Z98.0, Intestinal bypass and anastomosis status                       Z86.010, Personal history of colonic polyps CPT copyright 2019 American Medical Association. All rights reserved. The codes documented in this report are preliminary and upon coder review may  be revised to meet current compliance requirements. Kristina Kidneyeodoro K Joelynn Dust MD, MD 12/22/2018 1:42:35 PM This report has been signed electronically. Number of Addenda: 0 Note Initiated On: 12/22/2018 1:18 PM Scope Withdrawal Time: 0 hours 2 minutes 38 seconds  Total Procedure Duration: 0 hours 6 minutes 12 seconds  Estimated Blood Loss: Estimated blood loss: none.      [facility]

## 2018-12-22 NOTE — Interval H&P Note (Signed)
History and Physical Interval Note:  12/22/2018 1:13 PM  Kristina Macias  has presented today for surgery, with the diagnosis of [place].  The various methods of treatment have been discussed with the patient and family. After consideration of risks, benefits and other options for treatment, the patient has consented to  Procedure(s): ESOPHAGOGASTRODUODENOSCOPY (EGD) WITH PROPOFOL (N/A) COLONOSCOPY WITH PROPOFOL (N/A) as a surgical intervention.  The patient's history has been reviewed, patient examined, no change in status, stable for surgery.  I have reviewed the patient's chart and labs.  Questions were answered to the patient's satisfaction.     [place], [place]

## 2018-12-22 NOTE — Op Note (Signed)
[facility] Gastroenterology Patient Name: Kristina Macias Procedure Date: 12/22/2018 1:19 PM MRN: [id number] Account #: [id number] Date of Birth: 1951-04-26 Admit Type: Outpatient Age: 67 Room: [facility] ENDO ROOM 1 Gender: Female Note Status: Finalized Procedure:            Upper GI endoscopy Indications:          Dysphagia, Stricture of the esophagus Providers:            Benay Pike. Alice Reichert MD, MD Referring MD:         Leonie Douglas. Doy Hutching, MD (Referring MD) Medicines:            Propofol per Anesthesia Complications:        No immediate complications. Procedure:            Pre-Anesthesia Assessment:                       - The risks and benefits of the procedure and the                        sedation options and risks were discussed with the                        patient. All questions were answered and informed                        consent was obtained.                       - Patient identification and proposed procedure were                        verified prior to the procedure by the nurse. The                        procedure was verified in the procedure room.                       - ASA Grade Assessment: III - A patient with severe                        systemic disease.                       - After reviewing the risks and benefits, the patient                        was deemed in satisfactory condition to undergo the                        procedure.                       After obtaining informed consent, the endoscope was                        passed under direct vision. Throughout the procedure,                        the patient's blood pressure, pulse, and oxygen  saturations were monitored continuously. The Endoscope                        was introduced through the mouth, and advanced to the                        third part of duodenum. The upper GI endoscopy was                        accomplished without difficulty. The  patient tolerated                        the procedure well. Findings:      One benign-appearing, intrinsic moderate stenosis was found at the       gastroesophageal junction. This stenosis measured 1.3 cm (inner       diameter) x less than one cm (in length). The stenosis was traversed.       The scope was withdrawn. Dilation was performed with a Maloney dilator       with no resistance at 52 Fr.      A 2 cm hiatal hernia was present.      Localized mild inflammation characterized by congestion (edema) and       erythema was found in the gastric antrum.      The examined duodenum was normal.      The exam was otherwise without abnormality. Impression:           - Benign-appearing esophageal stenosis. Dilated.                       - 2 cm hiatal hernia.                       - Gastritis.                       - Normal examined duodenum.                       - The examination was otherwise normal.                       - No specimens collected. Recommendation:       - Monitor results to esophageal dilation                       - Proceed with colonoscopy Procedure Code(s):    --- Professional ---                       [id number], Esophagogastroduodenoscopy, flexible, transoral;                        diagnostic, including collection of specimen(s) by                        brushing or washing, when performed (separate procedure)                       43450, Dilation of esophagus, by unguided sound or                        bougie, single or multiple passes Diagnosis Code(s):    ---  Professional ---                       R13.10, Dysphagia, unspecified                       K29.70, Gastritis, unspecified, without bleeding                       K44.9, Diaphragmatic hernia without obstruction or                        gangrene                       K22.2, Esophageal obstruction CPT copyright 2019 American Medical Association. All rights reserved. The codes documented in this report are  preliminary and upon coder review may  be revised to meet current compliance requirements. Stanton Kidneyeodoro K Ahri Olson MD, MD 12/22/2018 1:31:55 PM This report has been signed electronically. Number of Addenda: 0 Note Initiated On: 12/22/2018 1:19 PM Estimated Blood Loss: Estimated blood loss: none.      [facility]

## 2018-12-22 NOTE — H&P (Signed)
Outpatient short stay form Pre-procedure 12/22/2018 11:52 AM Demaree Liberto K. Alice Reichert, M.D.  Primary Physician: Fulton Reek, M.D.  Reason for visit:  Dysphagia, hx esophageal stricture, personal hx of colon polyps.   History of present illness:  Patient is s/p EGD for esophageal food impaction in June 2020 by DR. Wohl, though food had spontaneously passed into the stomach. A distal esophageal stricture was present, but no dilation of the esophagus.   No current facility-administered medications for this encounter.   Current Outpatient Medications:  .  aspirin EC 81 MG EC tablet, Take 1 tablet (81 mg total) by mouth daily., Disp: 30 tablet, Rfl: 2 .  atorvastatin (LIPITOR) 80 MG tablet, Take 1 tablet (80 mg total) by mouth daily at 6 PM., Disp: 30 tablet, Rfl: 2 .  calcium-vitamin D (OSCAL WITH D) 500-200 MG-UNIT per tablet, Take 1 tablet by mouth., Disp: , Rfl:  .  clopidogrel (PLAVIX) 75 MG tablet, Take 1 tablet (75 mg total) by mouth daily. (Patient not taking: Reported on 11/09/2018), Disp: 30 tablet, Rfl: 2 .  KRILL OIL OMEGA-3 PO, Take 1 capsule by mouth daily., Disp: , Rfl:  .  metoprolol tartrate (LOPRESSOR) 25 MG tablet, Take 1 tablet (25 mg total) by mouth 2 (two) times daily., Disp: 60 tablet, Rfl: 2 .  nitroGLYCERIN (NITROSTAT) 0.4 MG SL tablet, Place 1 tablet (0.4 mg total) under the tongue every 5 (five) minutes as needed for chest pain. (Patient not taking: Reported on 11/09/2018), Disp: 30 tablet, Rfl: 2  No medications prior to admission.     No Known Allergies   Past Medical History:  Diagnosis Date  . Hypertension   . Myocardial infarction [facility])     Review of systems:  Otherwise negative.    Physical Exam  Gen: Alert, oriented. Appears stated age.  HEENT: [place]/AT. PERRLA. Lungs: CTA, no wheezes. CV: RR nl S1, S2. Abd: soft, benign, no masses. BS+ Ext: No edema. Pulses 2+    Planned procedures: Proceed with EGD and colonoscopy. The patient understands the  nature of the planned procedure, indications, risks, alternatives and potential complications including but not limited to bleeding, infection, perforation, damage to internal organs and possible oversedation/side effects from anesthesia. The patient agrees and gives consent to proceed.  Please refer to procedure notes for findings, recommendations and patient disposition/instructions.     Jisell Majer K. Alice Reichert, M.D. Gastroenterology 12/22/2018  11:52 AM

## 2018-12-23 ENCOUNTER — Encounter: Payer: Self-pay | Admitting: Internal Medicine

## 2018-12-24 NOTE — Anesthesia Postprocedure Evaluation (Signed)
Anesthesia Post Note  Patient: Kristina Macias  Procedure(s) Performed: ESOPHAGOGASTRODUODENOSCOPY (EGD) WITH PROPOFOL (N/A ) COLONOSCOPY WITH PROPOFOL (N/A )  Patient location during evaluation: Endoscopy Anesthesia Type: General Level of consciousness: awake and alert and oriented Pain management: pain level controlled Vital Signs Assessment: post-procedure vital signs reviewed and stable Respiratory status: spontaneous breathing Cardiovascular status: blood pressure returned to baseline Anesthetic complications: no     Last Vitals:  Vitals:   12/22/18 1352 12/22/18 1402  BP: (!) 100/57 117/70  Pulse: 87 84  Resp: 16 16  Temp:    SpO2: 100% 96%    Last Pain:  Vitals:   12/23/18 0726  TempSrc:   PainSc: 0-No pain                 Mira Balon

## 2022-03-07 ENCOUNTER — Ambulatory Visit: Payer: Medicare PPO

## 2022-03-13 ENCOUNTER — Ambulatory Visit (LOCAL_COMMUNITY_HEALTH_CENTER): Payer: Medicare PPO

## 2022-03-13 DIAGNOSIS — Z719 Counseling, unspecified: Secondary | ICD-10-CM

## 2022-03-13 DIAGNOSIS — Z23 Encounter for immunization: Secondary | ICD-10-CM | POA: Diagnosis not present

## 2022-03-13 NOTE — Progress Notes (Signed)
  Are you feeling sick today? No   Have you ever received a dose of COVID-19 Vaccine? [employer], [place], [place], [place], Other) Yes  If yes, which vaccine and how many doses?   PFIZER, 5   Did you bring the vaccination record card or other documentation?  Yes   Do you have a health condition or are undergoing treatment that makes you moderately or severely immunocompromised? This would include, but not be limited to: cancer, HIV, organ transplant, immunosuppressive therapy/high-dose corticosteroids, or moderate/severe primary immunodeficiency.  No  Have you received COVID-19 vaccine before or during hematopoietic cell transplant (HCT) or CAR-T-cell therapies? No  Have you ever had an allergic reaction to: (This would include a severe allergic reaction or a reaction that caused hives, swelling, or respiratory distress, including wheezing.) A component of a COVID-19 vaccine or a previous dose of COVID-19 vaccine? No   Have you ever had an allergic reaction to another vaccine (other thanCOVID-19 vaccine) or an injectable medication? (This would include a severe allergic reaction or a reaction that caused hives, swelling, or respiratory distress, including wheezing.)   No    Do you have a history of any of the following:  Myocarditis or Pericarditis No  Dermal fillers:  No  Multisystem Inflammatory Syndrome (MIS-C or MIS-A)? No  COVID-19 disease within the past 3 months? No  Vaccinated with monkeypox vaccine in the last 4 weeks? No  Eligible and administered Comirnaty 12y+2023-2024, and Fluzone high dose, monitored, tolerated well. Verbalized understanding of VIS and NCIR copy. M.Jamiere Gulas, LPN

## 2022-09-25 ENCOUNTER — Other Ambulatory Visit: Payer: Self-pay | Admitting: Internal Medicine

## 2022-09-25 DIAGNOSIS — Z1231 Encounter for screening mammogram for malignant neoplasm of breast: Secondary | ICD-10-CM

## 2023-05-07 ENCOUNTER — Inpatient Hospital Stay: Payer: Medicare PPO

## 2023-05-07 ENCOUNTER — Inpatient Hospital Stay: Payer: Medicare PPO | Attending: Oncology | Admitting: Oncology

## 2023-05-07 ENCOUNTER — Encounter: Payer: Self-pay | Admitting: Oncology

## 2023-05-07 VITALS — BP 141/84 | HR 57 | Temp 94.0°F | Resp 18 | Wt 146.0 lb

## 2023-05-07 DIAGNOSIS — Z7901 Long term (current) use of anticoagulants: Secondary | ICD-10-CM | POA: Diagnosis not present

## 2023-05-07 DIAGNOSIS — Z79899 Other long term (current) drug therapy: Secondary | ICD-10-CM | POA: Diagnosis not present

## 2023-05-07 DIAGNOSIS — I2699 Other pulmonary embolism without acute cor pulmonale: Secondary | ICD-10-CM | POA: Diagnosis present

## 2023-05-07 LAB — ANTITHROMBIN III: AntiThromb III Func: 100 % (ref 75–120)

## 2023-05-08 LAB — PROTEIN S PANEL
Protein S Activity: 81 % (ref 63–140)
Protein S Ag, Free: 87 % (ref 61–136)
Protein S Ag, Total: 85 % (ref 60–150)

## 2023-05-08 LAB — LUPUS ANTICOAGULANT
DRVVT: 58.4 s — ABNORMAL HIGH (ref 0.0–47.0)
PTT Lupus Anticoagulant: 30.1 s (ref 0.0–43.5)
Thrombin Time: 20.5 s (ref 0.0–23.0)
dPT Confirm Ratio: 0.95 {ratio} (ref 0.00–1.34)
dPT: 45.8 s (ref 0.0–47.6)

## 2023-05-08 LAB — CARDIOLIPIN ANTIBODIES, IGG, IGM, IGA
Anticardiolipin IgA: <9 [APL'U]/mL (ref 0–11)
Anticardiolipin IgG: <9 [GPL'U]/mL (ref 0–14)
Anticardiolipin IgM: <9 [MPL'U]/mL (ref 0–12)

## 2023-05-08 LAB — HEX PHASE PHOSPHOLIPID REFLEX

## 2023-05-08 LAB — BETA-2-GLYCOPROTEIN I ABS, IGG/M/A
Beta-2 Glyco I IgG: <9 GPI IgG units (ref 0–20)
Beta-2-Glycoprotein I IgA: <9 GPI IgA units (ref 0–25)
Beta-2-Glycoprotein I IgM: 11 GPI IgM units (ref 0–32)

## 2023-05-08 LAB — DRVVT CONFIRM: dRVVT Confirm: 0.8 {ratio} (ref 0.8–1.2)

## 2023-05-08 LAB — HEXAGONAL PHASE PHOSPHOLIPID: Hex Phosph Neut Test: 2 s (ref 0–11)

## 2023-05-08 LAB — DRVVT MIX: dRVVT Mix: 48.2 s — ABNORMAL HIGH (ref 0.0–40.4)

## 2023-05-09 LAB — PROTEIN C, TOTAL: Protein C, Total: 99 % (ref 60–150)

## 2023-05-13 ENCOUNTER — Encounter: Payer: Self-pay | Admitting: Oncology

## 2023-05-13 LAB — FACTOR 5 LEIDEN

## 2023-05-13 NOTE — Progress Notes (Signed)
Hematology/Oncology Consult note [facility] Telephone:([phone] [phone]  Patient Care Team: Marguarite Arbour, MD as PCP - General (Internal Medicine) Creig Hines, MD as Consulting Physician (Oncology)   Name of the patient: Kristina Macias  [id number]  March 09, 1951    Reason for referral-acute DVT   Referring physician-and drain PA  Date of visit: 05/13/23   History of presenting illness- Patient is a 72 year old female who was visiting family in [place] during Thanksgiving.  She noticed pain in her right leg and difficulty walking and ultimately underwent bilateral lower extremity ultrasound.  It showed acute DVT involving the right popliteal vein no DVT in the left lower extremity.  This was followed by CT angio chest which showed extensive acute pulmonary emboli involving the right main pulmonary artery and extending to at least the subsegmental level of all 5 lobes.  Evidence of acute heart strain.  Echocardiogram showed mildly dilated and mildly reduced systolic function of the right ventricle.  She underwent pulmonary thrombectomy on 1129.  She was transitioned to Eliquis and discharged.  Patient has never had prior history of DVT.  She has taken multiple trips to [place] before and has not had any DVT or PE with her car [place] prior.  Presently she is tolerating Eliquis well without any significant side effects.  Denies any changes in her appetite or weightShe has been up-to-date with her mammograms last colonoscopy was in August 2020 and did not show any evidence of bleeding or malignancy.  Repeat colonoscopy was recommended in 5 years  ECOG PS- 1  Pain scale- 0   Review of systems- Review of Systems  Constitutional:  Negative for chills, fever, malaise/fatigue and weight loss.  HENT:  Negative for congestion, ear discharge and nosebleeds.   Eyes:  Negative for blurred vision.  Respiratory:  Negative for cough, hemoptysis,  sputum production, shortness of breath and wheezing.   Cardiovascular:  Negative for chest pain, palpitations, orthopnea and claudication.  Gastrointestinal:  Negative for abdominal pain, blood in stool, constipation, diarrhea, heartburn, melena, nausea and vomiting.  Genitourinary:  Negative for dysuria, flank pain, frequency, hematuria and urgency.  Musculoskeletal:  Negative for back pain, joint pain and myalgias.  Skin:  Negative for rash.  Neurological:  Negative for dizziness, tingling, focal weakness, seizures, weakness and headaches.  Endo/Heme/Allergies:  Does not bruise/bleed easily.  Psychiatric/Behavioral:  Negative for depression and suicidal ideas. The patient does not have insomnia.     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    NSTEMI (non-ST elevated myocardial infarction) (HCC) 02/01/2015     Past Medical History:  Diagnosis Date   Hypertension    Myocardial infarction [facility])      Past Surgical History:  Procedure Laterality Date   CARDIAC CATHETERIZATION N/A 02/02/2015   Procedure: Left Heart Cath and Coronary Angiography;  Surgeon: Marcina Millard, MD;  Location: [facility] INVASIVE CV LAB;  Service: Cardiovascular;  Laterality: N/A;   CARDIAC CATHETERIZATION N/A 02/02/2015   Procedure: Coronary Stent Intervention;  Surgeon: Marcina Millard, MD;  Location: [facility] INVASIVE CV LAB;  Service: Cardiovascular;  Laterality: N/A;   cardiac stents x2     COLON SURGERY     COLONOSCOPY     COLONOSCOPY WITH PROPOFOL N/A 12/22/2018   Procedure: COLONOSCOPY WITH PROPOFOL;  Surgeon: Toledo, Boykin Nearing, MD;  Location: [facility] ENDOSCOPY;  Service: Endoscopy;  Laterality: N/A;   ESOPHAGOGASTRODUODENOSCOPY (EGD) WITH PROPOFOL  N/A 11/09/2018   Procedure: ESOPHAGOGASTRODUODENOSCOPY (EGD) WITH PROPOFOL;  Surgeon: Midge Minium, MD;  Location: [facility] ENDOSCOPY;  Service: Endoscopy;  Laterality: N/A;   ESOPHAGOGASTRODUODENOSCOPY (EGD)  WITH PROPOFOL N/A 12/22/2018   Procedure: ESOPHAGOGASTRODUODENOSCOPY (EGD) WITH PROPOFOL;  Surgeon: Toledo, Boykin Nearing, MD;  Location: [facility] ENDOSCOPY;  Service: Endoscopy;  Laterality: N/A;   stents      Social History   Socioeconomic History   Marital status: Married    Spouse name: Not on file   Number of children: Not on file   Years of education: Not on file   Highest education level: Not on file  Occupational History   Not on file  Tobacco Use   Smoking status: Never   Smokeless tobacco: Never  Vaping Use   Vaping status: Never Used  Substance and Sexual Activity   Alcohol use: No   Drug use: Never   Sexual activity: Not Currently  Other Topics Concern   Not on file  Social History Narrative   Not on file   Social Drivers of Health   Financial Resource Strain: Low Risk  (04/26/2023)   Received from [facility]   Overall Financial Resource Strain (CARDIA)    Difficulty of Paying Living Expenses: Not hard at all  Food Insecurity: No Food Insecurity (05/07/2023)   Hunger Vital Sign    Worried About Running Out of Food in the Last Year: Never true    Ran Out of Food in the Last Year: Never true  Transportation Needs: No Transportation Needs (05/07/2023)   PRAPARE - Administrator, Civil Service (Medical): No    Lack of Transportation (Non-Medical): No  Physical Activity: Not on file  Stress: Not on file  Social Connections: Not on file  Intimate Partner Violence: Not At Risk (05/07/2023)   Humiliation, Afraid, Rape, and Kick questionnaire    Fear of Current or Ex-Partner: No    Emotionally Abused: No    Physically Abused: No    Sexually Abused: No     Family History  Problem Relation Age of Onset   Heart Problems Mother    Rectal cancer Child        Currently in remission at age 34.     Current Outpatient Medications:    apixaban (ELIQUIS) 5 MG TABS tablet, Take by mouth., Disp: , Rfl:    aspirin EC 81 MG EC tablet, Take 1  tablet (81 mg total) by mouth daily., Disp: 30 tablet, Rfl: 2   atorvastatin (LIPITOR) 80 MG tablet, Take 1 tablet (80 mg total) by mouth daily at 6 PM., Disp: 30 tablet, Rfl: 2   calcium-vitamin D (OSCAL WITH D) 500-200 MG-UNIT per tablet, Take 1 tablet by mouth., Disp: , Rfl:    clotrimazole-betamethasone (LOTRISONE) cream, Apply topically 2 (two) times daily., Disp: , Rfl:    famotidine (PEPCID) 10 MG tablet, Take by mouth., Disp: , Rfl:    latanoprost (XALATAN) 0.005 % ophthalmic solution, 1 drop at bedtime., Disp: , Rfl:    meclizine (ANTIVERT) 12.5 MG tablet, Take 12.5 mg by mouth 3 (three) times daily as needed., Disp: , Rfl:    metoprolol tartrate (LOPRESSOR) 25 MG tablet, Take 1 tablet (25 mg total) by mouth 2 (two) times daily., Disp: 60 tablet, Rfl: 2   timolol (TIMOPTIC) 0.5 % ophthalmic solution, Apply to eye., Disp: , Rfl:    clopidogrel (PLAVIX) 75 MG tablet, Take 1 tablet (75 mg total) by mouth daily. (Patient not taking: Reported on  11/09/2018), Disp: 30 tablet, Rfl: 2   KRILL OIL OMEGA-3 PO, Take 1 capsule by mouth daily. (Patient not taking: Reported on 05/07/2023), Disp: , Rfl:    nitroGLYCERIN (NITROSTAT) 0.4 MG SL tablet, Place 1 tablet (0.4 mg total) under the tongue every 5 (five) minutes as needed for chest pain. (Patient not taking: Reported on 11/09/2018), Disp: 30 tablet, Rfl: 2   Physical exam:  Vitals:   05/07/23 1101 05/07/23 1107  BP: (!) 140/72 (!) 141/84  Pulse: (!) 57   Resp: 18   Temp: (!) 94 F (34.4 C)   TempSrc: Tympanic   SpO2: 100%   Weight: 146 lb (66.2 kg)    Physical Exam Cardiovascular:     Rate and Rhythm: Normal rate and regular rhythm.     Heart sounds: Normal heart sounds.  Pulmonary:     Effort: Pulmonary effort is normal.     Breath sounds: Normal breath sounds.  Abdominal:     General: Bowel sounds are normal.     Palpations: Abdomen is soft.  Musculoskeletal:        General: No swelling.     Right lower leg: No edema.     Left  lower leg: No edema.  Skin:    General: Skin is warm and dry.  Neurological:     Mental Status: She is alert and oriented to person, place, and time.           Latest Ref Rng & Units 02/03/2015    4:38 AM  CMP  Glucose 65 - 99 mg/dL 93   BUN 6 - 20 mg/dL 11   Creatinine 4.40 - 1.00 mg/dL 1.02   Sodium 725 - 366 mmol/L 141   Potassium 3.5 - 5.1 mmol/L 3.7   Chloride 101 - 111 mmol/L 112   CO2 22 - 32 mmol/L 26   Calcium 8.9 - 10.3 mg/dL 7.9       Latest Ref Rng & Units 02/02/2015    4:18 AM  CBC  WBC 3.6 - 11.0 K/uL 5.8   Hemoglobin 12.0 - 16.0 g/dL 44.0   Hematocrit 34.7 - 47.0 % 37.3   Platelets 150 - 440 K/uL 156     Assessment and plan- Patient is a 72 y.o. female referred for acute right lower extremity DVT  Patient has taken multiple trips to [place] over the years and has never had a DVT.  It is therefore difficult to attribute her right popliteal vein DVT merely to her car journey that she ended up going to [place].  I will be doing a hypercoagulable workup today including factor V Leiden, prothrombin gene mutation, protein C protein S and Antithrombin III testing as well as testing for antiphospholipid antibody syndrome.  I will see her back in about 4 to 5 weeks time to discuss the results of blood work either in person or virtual visit.  I would like the patient to take Eliquis at least for a year before deciding if he would like to stop.  Patient is otherwise active for her age, not obese and a non-smoker and we could consider stopping anticoagulation based on her D-dimer levels in 1 year.  Clinically no signs and symptoms of malignancy and patient is up-to-date with her colonoscopy and mammograms.  No routine indication for malignancy screening in the setting of DVT   Thank you for this kind referral and the opportunity to participate in the care of this  Patient   Visit Diagnosis 1. Bilateral pulmonary  embolism (HCC)     Dr. Owens Shark, MD, MPH [facility] at  [facility] [phone] 05/13/2023

## 2023-05-14 LAB — PROTHROMBIN GENE MUTATION

## 2023-06-17 ENCOUNTER — Encounter: Payer: Self-pay | Admitting: Oncology

## 2023-06-17 ENCOUNTER — Inpatient Hospital Stay: Payer: Medicare PPO | Attending: Oncology | Admitting: Oncology

## 2023-06-17 VITALS — BP 146/70 | HR 61 | Temp 96.1°F | Resp 18 | Wt 144.9 lb

## 2023-06-17 DIAGNOSIS — Z7901 Long term (current) use of anticoagulants: Secondary | ICD-10-CM | POA: Diagnosis not present

## 2023-06-17 DIAGNOSIS — Z86718 Personal history of other venous thrombosis and embolism: Secondary | ICD-10-CM | POA: Insufficient documentation

## 2023-06-17 DIAGNOSIS — Z86711 Personal history of pulmonary embolism: Secondary | ICD-10-CM | POA: Diagnosis not present

## 2023-06-17 NOTE — Progress Notes (Signed)
Hematology/Oncology Consult note [facility]  Telephone:([phone] [phone]  Patient Care Team: Marguarite Arbour, MD as PCP - General (Internal Medicine) Creig Hines, MD as Consulting Physician (Oncology)   Name of the patient: Kristina Macias  [id number]  Apr 10, 1951   Date of visit: 06/17/23  Diagnosis-unprovoked right lower extremity DVT and pulmonary embolism  Chief complaint/ Reason for visit-discussed results of hypercoagulable workup  Heme/Onc history: Patient is a 72 year old female who was visiting family in [place] during Thanksgiving.  She noticed pain in her right leg and difficulty walking and ultimately underwent bilateral lower extremity ultrasound.  It showed acute DVT involving the right popliteal vein no DVT in the left lower extremity.  This was followed by CT angio chest which showed extensive acute pulmonary emboli involving the right main pulmonary artery and extending to at least the subsegmental level of all 5 lobes.  Evidence of acute heart strain.  Echocardiogram showed mildly dilated and mildly reduced systolic function of the right ventricle.  She underwent pulmonary thrombectomy on 1129.  She was transitioned to Eliquis and discharged.  Patient has never had prior history of DVT.  She has taken multiple trips to [place] before and has not had any DVT or PE with her car [place] prior.  Presently she is tolerating Eliquis well without any significant side effects.  Denies any changes in her appetite or weightShe has been up-to-date with her mammograms last colonoscopy was in August 2020 and did not show any evidence of bleeding or malignancy.  Repeat colonoscopy was recommended in 5 years   Results of hypercoagulable Blood work from December 2024 showed no evidence of factor V Leiden or prothrombin gene mutation.  Protein C, protein S and Antithrombin III levels were normal.  Testing for and phospholipid antibody  syndrome was negative  Interval history-patient reports that her blood pressure readings have been high ever since he she has been on Eliquis.  Occasional self-limited nosebleeds  ECOG PS- 0 Pain scale- 0   Review of systems- Review of Systems  Constitutional:  Negative for chills, fever, malaise/fatigue and weight loss.  HENT:  Negative for congestion, ear discharge and nosebleeds.   Eyes:  Negative for blurred vision.  Respiratory:  Negative for cough, hemoptysis, sputum production, shortness of breath and wheezing.   Cardiovascular:  Negative for chest pain, palpitations, orthopnea and claudication.  Gastrointestinal:  Negative for abdominal pain, blood in stool, constipation, diarrhea, heartburn, melena, nausea and vomiting.  Genitourinary:  Negative for dysuria, flank pain, frequency, hematuria and urgency.  Musculoskeletal:  Negative for back pain, joint pain and myalgias.  Skin:  Negative for rash.  Neurological:  Negative for dizziness, tingling, focal weakness, seizures, weakness and headaches.  Endo/Heme/Allergies:  Does not bruise/bleed easily.  Psychiatric/Behavioral:  Negative for depression and suicidal ideas. The patient does not have insomnia.       No Known Allergies   Past Medical History:  Diagnosis Date   Hypertension    Myocardial infarction [facility])      Past Surgical History:  Procedure Laterality Date   CARDIAC CATHETERIZATION N/A 02/02/2015   Procedure: Left Heart Cath and Coronary Angiography;  Surgeon: Marcina Millard, MD;  Location: [facility] INVASIVE CV LAB;  Service: Cardiovascular;  Laterality: N/A;   CARDIAC CATHETERIZATION N/A 02/02/2015   Procedure: Coronary Stent Intervention;  Surgeon: Marcina Millard, MD;  Location: [facility] INVASIVE CV LAB;  Service: Cardiovascular;  Laterality: N/A;   cardiac stents x2  COLON SURGERY     COLONOSCOPY     COLONOSCOPY WITH PROPOFOL N/A 12/22/2018   Procedure: COLONOSCOPY WITH PROPOFOL;  Surgeon: Toledo,  Boykin Nearing, MD;  Location: [facility] ENDOSCOPY;  Service: Endoscopy;  Laterality: N/A;   ESOPHAGOGASTRODUODENOSCOPY (EGD) WITH PROPOFOL N/A 11/09/2018   Procedure: ESOPHAGOGASTRODUODENOSCOPY (EGD) WITH PROPOFOL;  Surgeon: Midge Minium, MD;  Location: [facility] ENDOSCOPY;  Service: Endoscopy;  Laterality: N/A;   ESOPHAGOGASTRODUODENOSCOPY (EGD) WITH PROPOFOL N/A 12/22/2018   Procedure: ESOPHAGOGASTRODUODENOSCOPY (EGD) WITH PROPOFOL;  Surgeon: Toledo, Boykin Nearing, MD;  Location: [facility] ENDOSCOPY;  Service: Endoscopy;  Laterality: N/A;   stents      Social History   Socioeconomic History   Marital status: Married    Spouse name: Not on file   Number of children: Not on file   Years of education: Not on file   Highest education level: Not on file  Occupational History   Not on file  Tobacco Use   Smoking status: Never   Smokeless tobacco: Never  Vaping Use   Vaping status: Never Used  Substance and Sexual Activity   Alcohol use: No   Drug use: Never   Sexual activity: Not Currently  Other Topics Concern   Not on file  Social History Narrative   Not on file   Social Drivers of Health   Financial Resource Strain: Low Risk  (04/26/2023)   Received [facility]   Overall Financial Resource Strain (CARDIA)    Difficulty of Paying Living Expenses: Not hard at all  Food Insecurity: No Food Insecurity (05/07/2023)   Hunger Vital Sign    Worried About Running Out of Food in the Last Year: Never true    Ran Out of Food in the Last Year: Never true  Transportation Needs: No Transportation Needs (05/07/2023)   PRAPARE - Administrator, Civil Service (Medical): No    Lack of Transportation (Non-Medical): No  Physical Activity: Not on file  Stress: Not on file  Social Connections: Not on file  Intimate Partner Violence: Not At Risk (05/07/2023)   Humiliation, Afraid, Rape, and Kick questionnaire    Fear of Current or Ex-Partner: No    Emotionally Abused: No     Physically Abused: No    Sexually Abused: No    Family History  Problem Relation Age of Onset   Heart Problems Mother    Rectal cancer Child        Currently in remission at age 61.     Current Outpatient Medications:    apixaban (ELIQUIS) 5 MG TABS tablet, Take by mouth., Disp: , Rfl:    aspirin EC 81 MG EC tablet, Take 1 tablet (81 mg total) by mouth daily., Disp: 30 tablet, Rfl: 2   atorvastatin (LIPITOR) 80 MG tablet, Take 1 tablet (80 mg total) by mouth daily at 6 PM., Disp: 30 tablet, Rfl: 2   calcium-vitamin D (OSCAL WITH D) 500-200 MG-UNIT per tablet, Take 1 tablet by mouth., Disp: , Rfl:    clopidogrel (PLAVIX) 75 MG tablet, Take 1 tablet (75 mg total) by mouth daily. (Patient not taking: Reported on 11/09/2018), Disp: 30 tablet, Rfl: 2   clotrimazole-betamethasone (LOTRISONE) cream, Apply topically 2 (two) times daily., Disp: , Rfl:    famotidine (PEPCID) 10 MG tablet, Take by mouth., Disp: , Rfl:    KRILL OIL OMEGA-3 PO, Take 1 capsule by mouth daily. (Patient not taking: Reported on 05/07/2023), Disp: , Rfl:    latanoprost (XALATAN) 0.005 % ophthalmic  solution, 1 drop at bedtime., Disp: , Rfl:    meclizine (ANTIVERT) 12.5 MG tablet, Take 12.5 mg by mouth 3 (three) times daily as needed., Disp: , Rfl:    metoprolol tartrate (LOPRESSOR) 25 MG tablet, Take 1 tablet (25 mg total) by mouth 2 (two) times daily., Disp: 60 tablet, Rfl: 2   nitroGLYCERIN (NITROSTAT) 0.4 MG SL tablet, Place 1 tablet (0.4 mg total) under the tongue every 5 (five) minutes as needed for chest pain. (Patient not taking: Reported on 11/09/2018), Disp: 30 tablet, Rfl: 2   timolol (TIMOPTIC) 0.5 % ophthalmic solution, Apply to eye., Disp: , Rfl:   Physical exam:  Vitals:   06/17/23 0936  BP: (!) 146/70  Pulse: 61  Resp: 18  Temp: (!) 96.1 F (35.6 C)  TempSrc: Tympanic  SpO2: 100%  Weight: 144 lb 14.4 oz (65.7 kg)   Physical Exam Cardiovascular:     Rate and Rhythm: Normal rate and regular rhythm.      Heart sounds: Normal heart sounds.  Pulmonary:     Effort: Pulmonary effort is normal.     Breath sounds: Normal breath sounds.  Skin:    General: Skin is warm and dry.  Neurological:     Mental Status: She is alert and oriented to person, place, and time.         Latest Ref Rng & Units 02/03/2015    4:38 AM  CMP  Glucose 65 - 99 mg/dL 93   BUN 6 - 20 mg/dL 11   Creatinine 9.14 - 1.00 mg/dL 7.82   Sodium 956 - 213 mmol/L 141   Potassium 3.5 - 5.1 mmol/L 3.7   Chloride 101 - 111 mmol/L 112   CO2 22 - 32 mmol/L 26   Calcium 8.9 - 10.3 mg/dL 7.9       Latest Ref Rng & Units 02/02/2015    4:18 AM  CBC  WBC 3.6 - 11.0 K/uL 5.8   Hemoglobin 12.0 - 16.0 g/dL 08.6   Hematocrit 57.8 - 47.0 % 37.3   Platelets 150 - 440 K/uL 156      Assessment and plan- Patient is a 73 y.o. female with history of right popliteal vein DVT and extensive pulmonary embolism in November 2024 likely unprovoked here to discuss hypercoagulable results and further management  Results of hypercoagulable workup was negative.  I have asked the patient to stay on Eliquis at least for a year before we decide whether we need to discontinue it or continue taking it indefinitely.  Hypertension unlikely related to Eliquis and I have asked her to keep a tab of her blood pressure readings at home and get in touch with primary care Dr. Judithann Sheen at the remain elevated.  Occasional nosebleeds likely exacerbated by the cold weather and have asked him to use over-the-counter nasal saline spray for the same.   Visit Diagnosis 1. History of pulmonary embolism   2. History of deep vein thrombosis (DVT) of lower extremity   3. Current use of long term anticoagulation      Dr. Owens Shark, MD, MPH [facility] at [facility] [phone] 06/17/2023 1:06 PM

## 2023-09-25 ENCOUNTER — Telehealth: Payer: Self-pay | Admitting: *Deleted

## 2023-09-25 ENCOUNTER — Encounter: Payer: Self-pay | Admitting: Oncology

## 2023-09-25 NOTE — Telephone Encounter (Signed)
 The patient got the instructions and she would be fine to go another day either before or after the seven 20/8 or what ever dates when Dr. Randy Buttery comes back.  Patient would like a call back with the new appointment and it came be before the vacation or after her vacation. I went an told megan that she needs to have another appt since she is not going to be here on 12/16/2023.  Kristina Macias will call her back with the new appointment,

## 2023-11-18 ENCOUNTER — Encounter: Payer: Self-pay | Admitting: Oncology

## 2023-11-18 ENCOUNTER — Inpatient Hospital Stay: Attending: Oncology | Admitting: Oncology

## 2023-11-18 VITALS — BP 145/72 | HR 64 | Temp 97.0°F | Resp 18 | Ht 69.0 in | Wt 149.3 lb

## 2023-11-18 DIAGNOSIS — Z86718 Personal history of other venous thrombosis and embolism: Secondary | ICD-10-CM | POA: Insufficient documentation

## 2023-11-18 DIAGNOSIS — Z7901 Long term (current) use of anticoagulants: Secondary | ICD-10-CM | POA: Diagnosis not present

## 2023-11-18 DIAGNOSIS — Z09 Encounter for follow-up examination after completed treatment for conditions other than malignant neoplasm: Secondary | ICD-10-CM

## 2023-11-18 DIAGNOSIS — Z86711 Personal history of pulmonary embolism: Secondary | ICD-10-CM | POA: Insufficient documentation

## 2023-11-18 NOTE — Progress Notes (Signed)
 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  She noticed pain in her right leg and difficulty walking and ultimately underwent bilateral lower extremity ultrasound.  It showed acute DVT involving the right popliteal vein no DVT in the left lower extremity.  This was followed by CT angio chest which showed extensive acute pulmonary emboli involving the right main pulmonary artery and extending to at least the subsegmental level of all 5 lobes.  Evidence of acute heart strain.  Echocardiogram showed mildly dilated and mildly reduced systolic function of the right ventricle.  She underwent pulmonary thrombectomy on 1129.  She was transitioned to Eliquis and discharged.  Patient has never had prior history of DVT.  She has taken multiple trips to [facility] [place]  before and has not had any DVT or PE with her car [place] prior.  Presently she is tolerating Eliquis well without any significant side effects.  Denies any changes in her appetite or weightShe has been up-to-date with her mammograms last colonoscopy was in August 2020 and did not show any evidence of bleeding or malignancy.  Repeat colonoscopy was recommended in 5 years    Results of hypercoagulable Blood work from December 2024 showed no evidence of factor V Leiden or prothrombin gene mutation.  Protein C, protein S and Antithrombin III  levels were normal.  Testing for and  phospholipid antibody syndrome was negative    Interval history-tolerating Eliquis well without any significant bleeding issues.  She has a $40 co-pay every month.  Denies any worsening leg swelling or worsening shortness of breath.  ECOG PS- 0 Pain scale- 0   Review of systems- Review of Systems  Constitutional:  Negative for chills, fever, malaise/fatigue and weight loss.  HENT:  Negative for congestion, ear discharge and nosebleeds.   Eyes:  Negative for blurred vision.  Respiratory:  Negative for cough, hemoptysis, sputum production, shortness of breath and wheezing.   Cardiovascular:  Negative for chest pain, palpitations, orthopnea and claudication.  Gastrointestinal:  Negative for abdominal pain, blood in stool, constipation, diarrhea, heartburn, melena, nausea and vomiting.  Genitourinary:  Negative for dysuria, flank pain, frequency, hematuria and urgency.  Musculoskeletal:  Negative for back pain, joint pain and myalgias.  Skin:  Negative for rash.  Neurological:  Negative for dizziness, tingling, focal weakness, seizures, weakness and headaches.  Endo/Heme/Allergies:  Does not bruise/bleed easily.  Psychiatric/Behavioral:  Negative for depression and suicidal ideas. The patient does not have insomnia.       No Known Allergies   Past Medical History:  Diagnosis Date   Hypertension    Myocardial infarction [facility])      Past Surgical History:  Procedure Laterality Date   CARDIAC CATHETERIZATION N/A 02/02/2015   Procedure: Left Heart Cath and Coronary Angiography;  Surgeon: Marsa Dooms, MD;  Location: [facility] INVASIVE CV LAB;  Service: Cardiovascular;  Laterality: N/A;   CARDIAC CATHETERIZATION N/A 02/02/2015   Procedure: Coronary Stent Intervention;  Surgeon: Marsa Dooms, MD;  Location: [facility] INVASIVE  CV LAB;  Service: Cardiovascular;  Laterality: N/A;   cardiac stents x2     COLON SURGERY     COLONOSCOPY     COLONOSCOPY WITH PROPOFOL  N/A 12/22/2018    Procedure: COLONOSCOPY WITH PROPOFOL ;  Surgeon: Toledo, Ladell POUR, MD;  Location: [facility] ENDOSCOPY;  Service: Endoscopy;  Laterality: N/A;   ESOPHAGOGASTRODUODENOSCOPY (EGD) WITH PROPOFOL  N/A 11/09/2018   Procedure: ESOPHAGOGASTRODUODENOSCOPY (EGD) WITH PROPOFOL ;  Surgeon: Jinny Carmine, MD;  Location: [facility] ENDOSCOPY;  Service: Endoscopy;  Laterality: N/A;   ESOPHAGOGASTRODUODENOSCOPY (EGD) WITH PROPOFOL  N/A 12/22/2018   Procedure: ESOPHAGOGASTRODUODENOSCOPY (EGD) WITH PROPOFOL ;  Surgeon: Toledo, Ladell POUR, MD;  Location: [facility] ENDOSCOPY;  Service: Endoscopy;  Laterality: N/A;   stents      Social History   Socioeconomic History   Marital status: Married    Spouse name: Not on file   Number of children: Not on file   Years of education: Not on file   Highest education level: Not on file  Occupational History   Not on file  Tobacco Use   Smoking status: Never   Smokeless tobacco: Never  Vaping Use   Vaping status: Never Used  Substance and Sexual Activity   Alcohol use: No   Drug use: Never   Sexual activity: Not Currently  Other Topics Concern   Not on file  Social History Narrative   Not on file   Social Drivers of Health   Financial Resource Strain: Low Risk  (04/26/2023)   Received from [facility]   Overall Financial Resource Strain (CARDIA)    Difficulty of Paying Living Expenses: Not hard at all  Food Insecurity: No Food Insecurity (05/07/2023)   Hunger Vital Sign    Worried About Running Out of Food in the Last Year: Never true    Ran Out of Food in the Last Year: Never true  Transportation Needs: No Transportation Needs (05/07/2023)   PRAPARE - Administrator, Civil Service (Medical): No    Lack of Transportation (Non-Medical): No  Physical Activity: Not on file  Stress: Not on file  Social Connections: Not on file  Intimate Partner Violence: Not At Risk (05/07/2023)   Humiliation, Afraid, Rape, and Kick questionnaire    Fear of Current  or Ex-Partner: No    Emotionally Abused: No    Physically Abused: No    Sexually Abused: No    Family History  Problem Relation Age of Onset   Heart Problems Mother    Rectal cancer Child        Currently in remission at age 61.     Current Outpatient Medications:    apixaban (ELIQUIS) 5 MG TABS tablet, Take by mouth., Disp: , Rfl:    aspirin  EC 81 MG EC tablet, Take 1 tablet (81 mg total) by mouth daily., Disp: 30 tablet, Rfl: 2   atorvastatin  (LIPITOR ) 80 MG tablet, Take 1 tablet (80 mg total) by mouth daily at 6 PM., Disp: 30 tablet, Rfl: 2   clotrimazole-betamethasone (LOTRISONE) cream, Apply topically 2 (two) times daily., Disp: , Rfl:    COMBIGAN 0.2-0.5 % ophthalmic solution, Apply 1 drop to eye 2 (two) times daily., Disp: , Rfl:    famotidine (PEPCID) 10 MG tablet, Take by mouth., Disp: , Rfl:    latanoprost (XALATAN) 0.005 % ophthalmic solution, 1 drop at bedtime., Disp: , Rfl:    meclizine (ANTIVERT) 12.5 MG tablet, Take 12.5 mg by mouth 3 (three) times daily as needed., Disp: , Rfl:  metoprolol  tartrate (LOPRESSOR ) 25 MG tablet, Take 1 tablet (25 mg total) by mouth 2 (two) times daily., Disp: 60 tablet, Rfl: 2   calcium -vitamin D (OSCAL WITH D) 500-200 MG-UNIT per tablet, Take 1 tablet by mouth. (Patient not taking: Reported on 11/18/2023), Disp: , Rfl:    clopidogrel  (PLAVIX ) 75 MG tablet, Take 1 tablet (75 mg total) by mouth daily. (Patient not taking: Reported on 11/09/2018), Disp: 30 tablet, Rfl: 2   KRILL OIL OMEGA-3 PO, Take 1 capsule by mouth daily. (Patient not taking: Reported on 05/07/2023), Disp: , Rfl:    nitroGLYCERIN  (NITROSTAT ) 0.4 MG SL tablet, Place 1 tablet (0.4 mg total) under the tongue every 5 (five) minutes as needed for chest pain. (Patient not taking: Reported on 11/09/2018), Disp: 30 tablet, Rfl: 2   timolol (TIMOPTIC) 0.5 % ophthalmic solution, Apply to eye. (Patient not taking: Reported on 11/18/2023), Disp: , Rfl:   Physical exam:  Vitals:    11/18/23 0924  BP: (!) 145/72  Pulse: 64  Resp: 18  Temp: (!) 97 F (36.1 C)  TempSrc: Tympanic  SpO2: 98%  Weight: 149 lb 4.8 oz (67.7 kg)  Height: 5' 9 (1.753 m)   Physical Exam  Cardiovascular:     Rate and Rhythm: Normal rate and regular rhythm.     Heart sounds: Normal heart sounds.  Pulmonary:     Effort: Pulmonary effort is normal.     Breath sounds: Normal breath sounds.  Abdominal:     General: Bowel sounds are normal.   Skin:    General: Skin is warm and dry.   Neurological:     Mental Status: She is alert and oriented to person, place, and time.      I have personally reviewed labs listed below:    Latest Ref Rng & Units 02/03/2015    4:38 AM  CMP  Glucose 65 - 99 mg/dL 93   BUN 6 - 20 mg/dL 11   Creatinine 9.55 - 1.00 mg/dL 8.84   Sodium 864 - 854 mmol/L 141   Potassium 3.5 - 5.1 mmol/L 3.7   Chloride 101 - 111 mmol/L 112   CO2 22 - 32 mmol/L 26   Calcium  8.9 - 10.3 mg/dL 7.9       Latest Ref Rng & Units 02/02/2015    4:18 AM  CBC  WBC 3.6 - 11.0 K/uL 5.8   Hemoglobin 12.0 - 16.0 g/dL 87.2   Hematocrit 64.9 - 47.0 % 37.3   Platelets 150 - 440 K/uL 156      Assessment and plan- Patient is a 73 y.o. female with history of unprovoked right popliteal vein DVT and pulmonary embolism in November 2024 currently on Eliquis here for routine follow-up  Patient had unprovoked massive pulmonary embolism and left lower extremity DVT in November 2024.  Results of hypercoagulable workup are negative.  I would like her to stay on anticoagulation for at least 6 more months and I will see her thereafter with CBC with differential BMP and D-dimer.  It would not be unreasonable for patient to stay on indefinite anticoagulation given the unprovoked episode however if the patient wishes to come off the blood thinner, it is a consideration as well.  Patient will like to think about it and let us  know.  D-dimer levels will also help us  decide in 6 months.  If she decides  to stay on Eliquis indefinitely we will bring down the dose to 2.5 mg twice daily after 6 months.  Patient verbalized understanding of the plan   Visit Diagnosis 1. History of deep vein thrombosis (DVT) of lower extremity   2. Current use of long term anticoagulation      Dr. Annah Skene, MD, MPH [facility] [phone] 11/18/2023 9:52 AM

## 2023-12-16 ENCOUNTER — Ambulatory Visit: Payer: Medicare PPO | Admitting: Oncology

## 2024-05-19 ENCOUNTER — Inpatient Hospital Stay: Attending: Oncology

## 2024-05-19 ENCOUNTER — Inpatient Hospital Stay (HOSPITAL_BASED_OUTPATIENT_CLINIC_OR_DEPARTMENT_OTHER): Admitting: Oncology

## 2024-05-19 ENCOUNTER — Encounter: Payer: Self-pay | Admitting: Oncology

## 2024-05-19 VITALS — BP 133/77 | HR 61 | Temp 96.9°F | Resp 19 | Ht 69.0 in | Wt 155.2 lb

## 2024-05-19 DIAGNOSIS — Z86711 Personal history of pulmonary embolism: Secondary | ICD-10-CM | POA: Insufficient documentation

## 2024-05-19 DIAGNOSIS — Z79899 Other long term (current) drug therapy: Secondary | ICD-10-CM | POA: Diagnosis not present

## 2024-05-19 DIAGNOSIS — Z86718 Personal history of other venous thrombosis and embolism: Secondary | ICD-10-CM | POA: Diagnosis not present

## 2024-05-19 DIAGNOSIS — I2699 Other pulmonary embolism without acute cor pulmonale: Secondary | ICD-10-CM | POA: Diagnosis not present

## 2024-05-19 DIAGNOSIS — I82402 Acute embolism and thrombosis of unspecified deep veins of left lower extremity: Secondary | ICD-10-CM | POA: Diagnosis not present

## 2024-05-19 DIAGNOSIS — Z7982 Long term (current) use of aspirin: Secondary | ICD-10-CM | POA: Insufficient documentation

## 2024-05-19 DIAGNOSIS — Z7901 Long term (current) use of anticoagulants: Secondary | ICD-10-CM | POA: Insufficient documentation

## 2024-05-19 DIAGNOSIS — Z800 Family history of malignant neoplasm of digestive organs: Secondary | ICD-10-CM | POA: Diagnosis not present

## 2024-05-19 LAB — BASIC METABOLIC PANEL - CANCER CENTER ONLY
Anion gap: 7 (ref 5–15)
BUN: 15 mg/dL (ref 8–23)
CO2: 27 mmol/L (ref 22–32)
Calcium: 9.1 mg/dL (ref 8.9–10.3)
Chloride: 108 mmol/L (ref 98–111)
Creatinine: 1.16 mg/dL — ABNORMAL HIGH (ref 0.44–1.00)
GFR, Estimated: 49 mL/min — ABNORMAL LOW
Glucose, Bld: 92 mg/dL (ref 70–99)
Potassium: 4.6 mmol/L (ref 3.5–5.1)
Sodium: 142 mmol/L (ref 135–145)

## 2024-05-19 LAB — CBC (CANCER CENTER ONLY)
HCT: 37.7 % (ref 36.0–46.0)
Hemoglobin: 12.7 g/dL (ref 12.0–15.0)
MCH: 33.4 pg (ref 26.0–34.0)
MCHC: 33.7 g/dL (ref 30.0–36.0)
MCV: 99.2 fL (ref 80.0–100.0)
Platelet Count: 164 K/uL (ref 150–400)
RBC: 3.8 MIL/uL — ABNORMAL LOW (ref 3.87–5.11)
RDW: 11.9 % (ref 11.5–15.5)
WBC Count: 5.5 K/uL (ref 4.0–10.5)
nRBC: 0 % (ref 0.0–0.2)

## 2024-05-19 LAB — D-DIMER, QUANTITATIVE: D-Dimer, Quant: 0.38 ug{FEU}/mL (ref 0.00–0.50)

## 2024-05-19 MED ORDER — APIXABAN 2.5 MG PO TABS: 2.5000 mg | ORAL_TABLET | Freq: Two times a day (BID) | ORAL | 2 refills | Status: AC

## 2024-05-19 NOTE — Progress Notes (Signed)
 "    Hematology/Oncology Consult note [facility]  Telephone:([phone] [phone]  Patient Care Team: Auston Reyes BIRCH, MD as PCP - General (Internal Medicine) Melanee Annah BROCKS, MD as Consulting Physician (Oncology)   Name of the patient: Kristina Macias  [id number]  [birth year]   Date of visit: 05/19/2024  Diagnosis-unprovoked left lower extremity DVT and pulmonary embolism  Chief complaint/ Reason for visit-discuss further management of history of DVT and PE  Heme/Onc history: Patient is a 73 year old female who was visiting family in [place]  during Thanksgiving.  She noticed pain in her right leg and difficulty walking and ultimately underwent bilateral lower extremity ultrasound.  It showed acute DVT involving the right popliteal vein no DVT in the left lower extremity.  This was followed by CT angio chest which showed extensive acute pulmonary emboli involving the right main pulmonary artery and extending to at least the subsegmental level of all 5 lobes.  Evidence of acute heart strain.  Echocardiogram showed mildly dilated and mildly reduced systolic function of the right ventricle.  She underwent pulmonary thrombectomy on 1129.  She was transitioned to Eliquis and discharged.  Patient has never had prior history of DVT.  She has taken multiple trips to [facility] [place]  before and has not had any DVT or PE with her car [place] prior.  Presently she is tolerating Eliquis well without any significant side effects.  Denies any changes in her appetite or weightShe has been up-to-date with her mammograms last colonoscopy was in August 2020 and did not show any evidence of bleeding or malignancy.  Repeat colonoscopy was recommended in 5 years    Results of hypercoagulable Blood work from December 2024 showed no evidence of factor V Leiden or prothrombin gene mutation.  Protein C, protein S and Antithrombin III  levels were normal.  Testing for and phospholipid  antibody syndrome was negative    Interval history- Kristina Macias is a 73 year old female with unprovoked venous thromboembolism who presents for hematology follow-up regarding ongoing anticoagulation management.  She experienced an acute pulmonary embolism and left lower extremity deep vein thrombosis in November 2024, without identifiable provoking factors and with a negative thrombophilia workup. She has been maintained on Eliquis 5 mg twice daily since the event, with no recurrence of thrombotic events.  She also takes low-dose aspirin  for secondary cardiac prevention following a prior myocardial infarction in 2016.  She describes occasional minor epistaxis, primarily during winter months, which she attributes to nasal dryness. She recalls a single episode of severe epistaxis requiring hospitalization while previously on clopidogrel , but with Eliquis, bleeding has been limited to minor spotting. She also notes occasional ecchymosis.  Recent laboratory evaluation revealed a normal D-dimer level, and recent lower extremity imaging and cardiac stress testing were unremarkable. She has no cost concerns regarding Eliquis and continues regular follow-up with her primary care provider and cardiologist.      ECOG PS- 1 Pain scale- 0   Review of systems- Review of Systems  Constitutional:  Negative for chills, fever, malaise/fatigue and weight loss.  HENT:  Negative for congestion, ear discharge and nosebleeds.   Eyes:  Negative for blurred vision.  Respiratory:  Negative for cough, hemoptysis, sputum production, shortness of breath and wheezing.   Cardiovascular:  Negative for chest pain, palpitations, orthopnea and claudication.  Gastrointestinal:  Negative for abdominal pain, blood in stool, constipation, diarrhea, heartburn, melena, nausea and vomiting.  Genitourinary:  Negative for dysuria, flank pain, frequency, hematuria and urgency.  Musculoskeletal:  Negative for back pain, joint pain  and myalgias.  Skin:  Negative for rash.  Neurological:  Negative for dizziness, tingling, focal weakness, seizures, weakness and headaches.  Endo/Heme/Allergies:  Does not bruise/bleed easily.  Psychiatric/Behavioral:  Negative for depression and suicidal ideas. The patient does not have insomnia.       Allergies[1]   Past Medical History:  Diagnosis Date   Hypertension    Myocardial infarction [facility])      Past Surgical History:  Procedure Laterality Date   CARDIAC CATHETERIZATION N/A 02/02/2015   Procedure: Left Heart Cath and Coronary Angiography;  Surgeon: Marsa Dooms, MD;  Location: [facility] INVASIVE CV LAB;  Service: Cardiovascular;  Laterality: N/A;   CARDIAC CATHETERIZATION N/A 02/02/2015   Procedure: Coronary Stent Intervention;  Surgeon: Marsa Dooms, MD;  Location: [facility] INVASIVE CV LAB;  Service: Cardiovascular;  Laterality: N/A;   cardiac stents x2     COLON SURGERY     COLONOSCOPY     COLONOSCOPY WITH PROPOFOL  N/A 12/22/2018   Procedure: COLONOSCOPY WITH PROPOFOL ;  Surgeon: Toledo, Ladell POUR, MD;  Location: [facility] ENDOSCOPY;  Service: Endoscopy;  Laterality: N/A;   ESOPHAGOGASTRODUODENOSCOPY (EGD) WITH PROPOFOL  N/A 11/09/2018   Procedure: ESOPHAGOGASTRODUODENOSCOPY (EGD) WITH PROPOFOL ;  Surgeon: Jinny Carmine, MD;  Location: [facility] ENDOSCOPY;  Service: Endoscopy;  Laterality: N/A;   ESOPHAGOGASTRODUODENOSCOPY (EGD) WITH PROPOFOL  N/A 12/22/2018   Procedure: ESOPHAGOGASTRODUODENOSCOPY (EGD) WITH PROPOFOL ;  Surgeon: Toledo, Ladell POUR, MD;  Location: [facility] ENDOSCOPY;  Service: Endoscopy;  Laterality: N/A;   stents      Social History   Socioeconomic History   Marital status: Married    Spouse name: Not on file   Number of children: Not on file   Years of education: Not on file   Highest education level: Not on file  Occupational History   Not on file  Tobacco Use   Smoking status: Never   Smokeless tobacco: Never  Vaping Use   Vaping status: Never Used   Substance and Sexual Activity   Alcohol use: No   Drug use: Never   Sexual activity: Not Currently  Other Topics Concern   Not on file  Social History Narrative   Not on file   Social Drivers of Health   Tobacco Use: Low Risk (05/19/2024)   Patient History    Smoking Tobacco Use: Never    Smokeless Tobacco Use: Never    Passive Exposure: Not on file  Financial Resource Strain: Low Risk  (12/29/2023)   Received from [facility]   Overall Financial Resource Strain (CARDIA)    Difficulty of Paying Living Expenses: Not hard at all  Food Insecurity: No Food Insecurity (12/29/2023)   Received from [facility]   Epic    Within the past 12 months, you worried that your food would run out before you got the money to buy more.: Never true    Within the past 12 months, the food you bought just didn't last and you didn't have money to get more.: Never true  Transportation Needs: No Transportation Needs (12/29/2023)   Received from [facility] - Transportation    In the past 12 months, has lack of transportation kept you from medical appointments or from getting medications?: No    Lack of Transportation (Non-Medical): No  Physical Activity: Not on file  Stress: Not on file  Social Connections: Not on file  Intimate Partner Violence: Not At Risk (05/07/2023)   Humiliation, Afraid, Rape, and  Kick questionnaire    Fear of Current or Ex-Partner: No    Emotionally Abused: No    Physically Abused: No    Sexually Abused: No  Depression (PHQ2-9): Low Risk (05/19/2024)   Depression (PHQ2-9)    PHQ-2 Score: 0  Alcohol Screen: Not on file  Housing: Low Risk  (04/15/2024)   Received from [facility]   Epic    In the last 12 months, was there a time when you were not able to pay the mortgage or rent on time?: No    In the past 12 months, how many times have you moved where you were living?: 1    At any time in the  past 12 months, were you homeless or living in a shelter (including now)?: No  Utilities: Not At Risk (12/29/2023)   Received [facility]   Epic    In the past 12 months has the electric, gas, oil, or water company threatened to shut off services in your home?: No  Health Literacy: Not on file    Family History  Problem Relation Age of Onset   Heart Problems Mother    Rectal cancer Child        Currently in remission at age 107.    Current Medications[2]  Physical exam:  Vitals:   05/19/24 1021  BP: 133/77  Pulse: 61  Resp: 19  Temp: (!) 96.9 F (36.1 C)  TempSrc: Tympanic  SpO2: 99%  Weight: 155 lb 3.2 oz (70.4 kg)  Height: 5' 9 (1.753 m)   Physical Exam Cardiovascular:     Rate and Rhythm: Normal rate and regular rhythm.     Heart sounds: Normal heart sounds.  Pulmonary:     Effort: Pulmonary effort is normal.     Breath sounds: Normal breath sounds.  Skin:    General: Skin is warm and dry.  Neurological:     Mental Status: She is alert and oriented to person, place, and time.      I have personally reviewed labs listed below:    Latest Ref Rng & Units 05/19/2024   10:00 AM  CMP  Glucose 70 - 99 mg/dL 92   BUN 8 - 23 mg/dL 15   Creatinine 9.55 - 1.00 mg/dL 8.83   Sodium 864 - 854 mmol/L 142   Potassium 3.5 - 5.1 mmol/L 4.6   Chloride 98 - 111 mmol/L 108   CO2 22 - 32 mmol/L 27   Calcium  8.9 - 10.3 mg/dL 9.1       Latest Ref Rng & Units 05/19/2024    9:59 AM  CBC  WBC 4.0 - 10.5 K/uL 5.5   Hemoglobin 12.0 - 15.0 g/dL 87.2   Hematocrit 63.9 - 46.0 % 37.7   Platelets 150 - 400 K/uL 164     Assessment and plan- Patient is a 73 y.o. female with history of unprovoked left lower extremity DVT and pulmonary embolism in November 2024 presently on Eliquis here for a routine follow-up  Assessment and Plan    Venous thromboembolism (pulmonary embolism and deep vein thrombosis) Unprovoked VTE with negative thrombophilia workup and  normal D-dimer. Tolerated one year of apixaban with minor bleeding. Discussed risks of recurrent VTE and bleeding with concurrent low-dose aspirin . Decided to reduce apixaban dose for extended prevention. - Reduced apixaban to 2.5 mg twice daily for extended secondary prevention of VTE. - Discontinued apixaban 5 mg twice daily dosing. - Instructed her to notify her primary  care provider for future refills. - Advised her to monitor for major bleeding and discontinue apixaban if significant bleeding occurs. - Recommended continued follow-up with her primary care provider; no further hematology follow-up required at this time.         Visit Diagnosis 1. History of deep vein thrombosis (DVT) of lower extremity      Dr. Annah Skene, MD, MPH [facility] [phone] 05/19/2024 11:16 AM                   [1] No Known Allergies [2]  Current Outpatient Medications:    apixaban (ELIQUIS) 2.5 MG TABS tablet, Take 1 tablet (2.5 mg total) by mouth 2 (two) times daily., Disp: 60 tablet, Rfl: 2   aspirin  EC 81 MG EC tablet, Take 1 tablet (81 mg total) by mouth daily., Disp: 30 tablet, Rfl: 2   atorvastatin  (LIPITOR ) 80 MG tablet, Take 1 tablet (80 mg total) by mouth daily at 6 PM., Disp: 30 tablet, Rfl: 2   COMBIGAN 0.2-0.5 % ophthalmic solution, Apply 1 drop to eye 2 (two) times daily., Disp: , Rfl:    famotidine (PEPCID) 10 MG tablet, Take by mouth., Disp: , Rfl:    latanoprost (XALATAN) 0.005 % ophthalmic solution, 1 drop at bedtime., Disp: , Rfl:    meclizine (ANTIVERT) 12.5 MG tablet, Take 12.5 mg by mouth 3 (three) times daily as needed., Disp: , Rfl:    metoprolol  tartrate (LOPRESSOR ) 25 MG tablet, Take 1 tablet (25 mg total) by mouth 2 (two) times daily., Disp: 60 tablet, Rfl: 2  "

## 2024-05-19 NOTE — Progress Notes (Signed)
 Patient doing okay. She stated she's been having nosebleeds since starting blood thinners and discovering bruises.

## 2024-05-22 ENCOUNTER — Encounter: Payer: Self-pay | Admitting: *Deleted

## 2024-05-29 ENCOUNTER — Encounter
Admission: RE | Disposition: A | Discharge status: Home / Self Care | Payer: Self-pay | Source: Home / Self Care | Attending: Gastroenterology

## 2024-05-29 ENCOUNTER — Ambulatory Visit
Admission: RE | Admit: 2024-05-29 | Discharge: 2024-05-29 | Disposition: A | Discharge status: Home / Self Care | Attending: Gastroenterology | Admitting: Gastroenterology

## 2024-05-29 ENCOUNTER — Other Ambulatory Visit: Payer: Self-pay

## 2024-05-29 ENCOUNTER — Ambulatory Visit: Admitting: Anesthesiology

## 2024-05-29 DIAGNOSIS — K640 First degree hemorrhoids: Secondary | ICD-10-CM | POA: Insufficient documentation

## 2024-05-29 DIAGNOSIS — Z1211 Encounter for screening for malignant neoplasm of colon: Secondary | ICD-10-CM | POA: Diagnosis present

## 2024-05-29 DIAGNOSIS — I10 Essential (primary) hypertension: Secondary | ICD-10-CM | POA: Insufficient documentation

## 2024-05-29 DIAGNOSIS — Z980 Intestinal bypass and anastomosis status: Secondary | ICD-10-CM | POA: Diagnosis not present

## 2024-05-29 DIAGNOSIS — Z860100 Personal history of colon polyps, unspecified: Secondary | ICD-10-CM | POA: Insufficient documentation

## 2024-05-29 DIAGNOSIS — Z955 Presence of coronary angioplasty implant and graft: Secondary | ICD-10-CM | POA: Insufficient documentation

## 2024-05-29 DIAGNOSIS — I252 Old myocardial infarction: Secondary | ICD-10-CM | POA: Diagnosis not present

## 2024-05-29 HISTORY — PX: COLONOSCOPY: SHX5424

## 2024-05-29 MED ORDER — ONDANSETRON HCL 4 MG/2ML IJ SOLN
INTRAMUSCULAR | Status: AC
  Filled 2024-05-29: qty 2

## 2024-05-29 MED ORDER — PROPOFOL 10 MG/ML IV BOLUS
INTRAVENOUS | Status: DC | PRN
  Administered 2024-05-29: 50 mg via INTRAVENOUS

## 2024-05-29 MED ORDER — LIDOCAINE HCL (CARDIAC) PF 100 MG/5ML IV SOSY
PREFILLED_SYRINGE | INTRAVENOUS | Status: DC | PRN
  Administered 2024-05-29: 60 mg via INTRAVENOUS

## 2024-05-29 MED ORDER — DEXMEDETOMIDINE HCL IN NACL 80 MCG/20ML IV SOLN
INTRAVENOUS | Status: DC | PRN
  Administered 2024-05-29: 8 ug via INTRAVENOUS
  Administered 2024-05-29: 4 ug via INTRAVENOUS
  Administered 2024-05-29: 8 ug via INTRAVENOUS

## 2024-05-29 MED ORDER — PROPOFOL 1000 MG/100ML IV EMUL
INTRAVENOUS | Status: AC
  Filled 2024-05-29: qty 100

## 2024-05-29 MED ORDER — PROPOFOL 500 MG/50ML IV EMUL
INTRAVENOUS | Status: DC | PRN
  Administered 2024-05-29: 75 ug/kg/min via INTRAVENOUS

## 2024-05-29 MED ORDER — SODIUM CHLORIDE 0.9 % IV SOLN: INTRAVENOUS | Status: DC

## 2024-05-29 NOTE — Interval H&P Note (Signed)
 History and Physical Interval Note:  05/29/2024 8:16 AM  Kristina Macias  has presented today for surgery, with the diagnosis of hX OF ADENOMATOUS POLYP OIF COLON.  The various methods of treatment have been discussed with the patient and family. After consideration of risks, benefits and other options for treatment, the patient has consented to  Procedures: COLONOSCOPY (N/A) as a surgical intervention.  The patient's history has been reviewed, patient examined, no change in status, stable for surgery.  I have reviewed the patient's chart and labs.  Questions were answered to the patient's satisfaction.     Kristina Macias  Ok to proceed with colonoscopy

## 2024-05-29 NOTE — Anesthesia Preprocedure Evaluation (Signed)
 "                                  Anesthesia Evaluation  Patient identified by MRN, date of birth, ID band Patient awake    Reviewed: Allergy & Precautions, NPO status , Patient's Chart, lab work & pertinent test results  Airway Mallampati: II  TM Distance: >3 FB Neck ROM: Full    Dental  (+) Upper Dentures, Lower Dentures   Pulmonary neg pulmonary ROS   Pulmonary exam normal breath sounds clear to auscultation       Cardiovascular Exercise Tolerance: Good hypertension, Pt. on medications + Past MI and + Cardiac Stents  negative cardio ROS Normal cardiovascular exam Rhythm:Regular Rate:Normal     Neuro/Psych negative neurological ROS  negative psych ROS   GI/Hepatic negative GI ROS, Neg liver ROS,,,  Endo/Other  negative endocrine ROS    Renal/GU negative Renal ROS  negative genitourinary   Musculoskeletal   Abdominal Normal abdominal exam  (+)   Peds negative pediatric ROS (+)  Hematology negative hematology ROS (+)   Anesthesia Other Findings Past Medical History: No date: Hypertension No date: Myocardial infarction [facility])  Past Surgical History: 02/02/2015: CARDIAC CATHETERIZATION; N/A     Comment:  Procedure: Left Heart Cath and Coronary Angiography;                Surgeon: Marsa Dooms, MD;  Location: [facility]               INVASIVE CV LAB;  Service: Cardiovascular;  Laterality:               N/A; 02/02/2015: CARDIAC CATHETERIZATION; N/A     Comment:  Procedure: Coronary Stent Intervention;  Surgeon:               Marsa Dooms, MD;  Location: [facility] INVASIVE CV LAB;              Service: Cardiovascular;  Laterality: N/A; No date: cardiac stents x2 No date: COLON SURGERY No date: COLONOSCOPY 12/22/2018: COLONOSCOPY WITH PROPOFOL ; N/A     Comment:  Procedure: COLONOSCOPY WITH PROPOFOL ;  Surgeon: Toledo,               Ladell POUR, MD;  Location: [facility] ENDOSCOPY;  Service:               Endoscopy;  Laterality: N/A; 11/09/2018:  ESOPHAGOGASTRODUODENOSCOPY (EGD) WITH PROPOFOL ; N/A     Comment:  Procedure: ESOPHAGOGASTRODUODENOSCOPY (EGD) WITH               PROPOFOL ;  Surgeon: Jinny Carmine, MD;  Location: [facility]               ENDOSCOPY;  Service: Endoscopy;  Laterality: N/A; 12/22/2018: ESOPHAGOGASTRODUODENOSCOPY (EGD) WITH PROPOFOL ; N/A     Comment:  Procedure: ESOPHAGOGASTRODUODENOSCOPY (EGD) WITH               PROPOFOL ;  Surgeon: Toledo, Ladell POUR, MD;  Location:               [facility] ENDOSCOPY;  Service: Endoscopy;  Laterality: N/A; No date: stents  BMI    Body Mass Index: 21.91 kg/m      Reproductive/Obstetrics negative OB ROS                              Anesthesia Physical Anesthesia Plan  ASA: 2  Anesthesia Plan: General   Post-op Pain Management:    Induction: Intravenous  PONV Risk Score and Plan: Propofol  infusion and TIVA  Airway Management Planned: Natural Airway and Nasal Cannula  Additional Equipment:   Intra-op Plan:   Post-operative Plan:   Informed Consent: I have reviewed the patients History and Physical, chart, labs and discussed the procedure including the risks, benefits and alternatives for the proposed anesthesia with the patient or authorized representative who has indicated his/her understanding and acceptance.     Dental Advisory Given  Plan Discussed with: CRNA  Anesthesia Plan Comments:         Anesthesia Quick Evaluation  "

## 2024-05-29 NOTE — Transfer of Care (Signed)
 Immediate Anesthesia Transfer of Care Note  Patient: Kristina Macias  Procedure(s) Performed: COLONOSCOPY  Patient Location: PACU  Anesthesia Type:General  Level of Consciousness: sedated  Airway & Oxygen Therapy: Patient Spontanous Breathing  Post-op Assessment: Report given to RN and Post -op Vital signs reviewed and stable  Post vital signs: Reviewed and stable  Last Vitals:  Vitals Value Taken Time  BP 102/65 05/29/24 08:39  Temp    Pulse 82 05/29/24 08:39  Resp 20 05/29/24 08:39  SpO2 100 % 05/29/24 08:39  Vitals shown include unfiled device data.  Last Pain:  Vitals:   05/29/24 0723  TempSrc: Temporal  PainSc: 0-No pain         Complications: No notable events documented.

## 2024-05-29 NOTE — Anesthesia Postprocedure Evaluation (Signed)
"   Anesthesia Post Note  Patient: Kristina Macias  Procedure(s) Performed: COLONOSCOPY  Patient location during evaluation: PACU Anesthesia Type: General Level of consciousness: awake Pain management: satisfactory to patient Vital Signs Assessment: post-procedure vital signs reviewed and stable Respiratory status: spontaneous breathing Cardiovascular status: stable Anesthetic complications: no   No notable events documented.   Last Vitals:  Vitals:   05/29/24 0850 05/29/24 0859  BP: 109/66 122/62  Pulse: 65 65  Resp: 17 17  Temp:  (!) 36.1 C  SpO2: 100% 100%    Last Pain:  Vitals:   05/29/24 0859  TempSrc:   PainSc: 0-No pain                 VAN STAVEREN,Emara Lichter      "

## 2024-05-29 NOTE — H&P (Signed)
 Outpatient short stay form Pre-procedure 05/29/2024  Kristina ONEIDA Schick, MD  Primary Physician: Auston Reyes BIRCH, MD  Reason for visit:  Surveillance  History of present illness:    74 y/o lady with history of CAD and hypertension on eliquis  with last dose 4 days ago here for surveillance colonoscopy. Last colonoscopy in 2020 was normal. Had partial colectomy in 2009 for large polyp. No family history of GI malignancies.   Current Medications[1]  Medications Prior to Admission  Medication Sig Dispense Refill Last Dose/Taking   atorvastatin  (LIPITOR ) 80 MG tablet Take 1 tablet (80 mg total) by mouth daily at 6 PM. 30 tablet 2 05/28/2024   famotidine (PEPCID) 10 MG tablet Take by mouth.   Past Month   metoprolol  tartrate (LOPRESSOR ) 25 MG tablet Take 1 tablet (25 mg total) by mouth 2 (two) times daily. 60 tablet 2 05/28/2024   apixaban  (ELIQUIS ) 2.5 MG TABS tablet Take 1 tablet (2.5 mg total) by mouth 2 (two) times daily. 60 tablet 2 05/26/2024   aspirin  EC 81 MG EC tablet Take 1 tablet (81 mg total) by mouth daily. 30 tablet 2 05/26/2024   COMBIGAN 0.2-0.5 % ophthalmic solution Apply 1 drop to eye 2 (two) times daily.      latanoprost (XALATAN) 0.005 % ophthalmic solution 1 drop at bedtime.      meclizine (ANTIVERT) 12.5 MG tablet Take 12.5 mg by mouth 3 (three) times daily as needed.        Allergies[2]   Past Medical History:  Diagnosis Date   Hypertension    Myocardial infarction [facility])     Review of systems:  Otherwise negative.    Physical Exam  Gen: Alert, oriented. Appears stated age.  HEENT: PERRLA. Lungs: No respiratory distress CV: RRR Abd: soft, benign, no masses Ext: No edema    Planned procedures: Proceed with colonoscopy. The patient understands the nature of the planned procedure, indications, risks, alternatives and potential complications including but not limited to bleeding, infection, perforation, damage to internal organs and possible oversedation/side  effects from anesthesia. The patient agrees and gives consent to proceed.  Please refer to procedure notes for findings, recommendations and patient disposition/instructions.     Kristina ONEIDA Schick, MD [facility]         [1]  Current Facility-Administered Medications:    0.9 %  sodium chloride  infusion, , Intravenous, Continuous, Keryn Nessler, Kristina ONEIDA, MD, Last Rate: 20 mL/hr at 05/29/24 0737, Continued from Pre-op at 05/29/24 0737 [2] No Known Allergies

## 2024-05-29 NOTE — Op Note (Signed)
 [facility] Patient Name: Kristina Macias Procedure Date: 05/29/2024 8:18 AM MRN: [id number] Account #: [id number] Date of Birth: April 01, 1951 Admit Type: Outpatient Age: 74 Room: [facility] ENDO ROOM 3 Gender: Female Note Status: Finalized Instrument Name: Colon Scope [id number] Procedure:             Colonoscopy Indications:           High risk colon cancer surveillance: Personal history                         of colonic polyps, Last colonoscopy 5 years ago Providers:             Ole Schick MD, MD Referring MD:          Reyes BIRCH. Auston, MD (Referring MD) Medicines:             Monitored Anesthesia Care Complications:         No immediate complications. Procedure:             Pre-Anesthesia Assessment:                        - Prior to the procedure, a History and Physical was                         performed, and patient medications and allergies were                         reviewed. The patient is competent. The risks and                         benefits of the procedure and the sedation options and                         risks were discussed with the patient. All questions                         were answered and informed consent was obtained.                         Patient identification and proposed procedure were                         verified by the physician, the nurse, the                         anesthesiologist, the anesthetist and the technician                         in the endoscopy suite. Mental Status Examination:                         alert and oriented. Airway Examination: normal                         oropharyngeal airway and neck mobility. Respiratory                         Examination: clear to auscultation. CV Examination:  normal. Prophylactic Antibiotics: The patient does not                         require prophylactic antibiotics. Prior                         Anticoagulants: The patient has  taken no anticoagulant                         or antiplatelet agents. ASA Grade Assessment: II - A                         patient with mild systemic disease. After reviewing                         the risks and benefits, the patient was deemed in                         satisfactory condition to undergo the procedure. The                         anesthesia plan was to use monitored anesthesia care                         (MAC). Immediately prior to administration of                         medications, the patient was re-assessed for adequacy                         to receive sedatives. The heart rate, respiratory                         rate, oxygen saturations, blood pressure, adequacy of                         pulmonary ventilation, and response to care were                         monitored throughout the procedure. The physical                         status of the patient was re-assessed after the                         procedure.                        After obtaining informed consent, the colonoscope was                         passed under direct vision. Throughout the procedure,                         the patient's blood pressure, pulse, and oxygen                         saturations were monitored continuously. The  Colonoscope was introduced through the anus and                         advanced to the the ileocolonic anastomosis. The                         colonoscopy was performed without difficulty. The                         patient tolerated the procedure well. The quality of                         the bowel preparation was good. The rectum was                         photographed. Findings:      The perianal and digital rectal examinations were normal.      There was evidence of a prior end-to-side ileo-colonic anastomosis in       the ascending colon. This was patent and was characterized by healthy       appearing mucosa.      Internal  hemorrhoids were found during retroflexion. The hemorrhoids       were Grade I (internal hemorrhoids that do not prolapse).      The exam was otherwise without abnormality on direct and retroflexion       views. Impression:            - Patent end-to-side ileo-colonic anastomosis,                         characterized by healthy appearing mucosa.                        - Internal hemorrhoids.                        - The examination was otherwise normal on direct and                         retroflexion views.                        - No specimens collected. Recommendation:        - Discharge patient to home.                        - Resume previous diet.                        - Continue present medications.                        - Repeat colonoscopy is not recommended due to current                         age (61 years or older) for surveillance.                        - Return to referring physician as previously  scheduled. Procedure Code(s):     --- Professional ---                        H9894, Colorectal cancer screening; colonoscopy on                         individual at high risk Diagnosis Code(s):     --- Professional ---                        Z86.010, Personal history of colonic polyps                        Z98.0, Intestinal bypass and anastomosis status                        K64.0, First degree hemorrhoids CPT copyright 2022 [employer]. All rights reserved. The codes documented in this report are preliminary and upon coder review may  be revised to meet current compliance requirements. Ole Schick MD, MD 05/29/2024 8:46:31 AM Number of Addenda: 0 Note Initiated On: 05/29/2024 8:18 AM Scope Withdrawal Time: 0 hours 7 minutes 4 seconds  Total Procedure Duration: 0 hours 11 minutes 29 seconds  Estimated Blood Loss:  Estimated blood loss: none.      [facility]
